# Patient Record
Sex: Male | Born: 1965 | Race: White | Hispanic: No | Marital: Married | Smoking: Never smoker
Health system: Southern US, Community
[De-identification: ages and names within clinical notes are randomized; demographics above are authoritative.]

## PROBLEM LIST (undated history)

## (undated) HISTORY — PX: TONSILLECTOMY: SUR1361

## (undated) HISTORY — PX: SKIN GRAFT: SHX250

## (undated) HISTORY — PX: KIDNEY SURGERY: SHX687

---

## 2012-11-23 ENCOUNTER — Encounter (HOSPITAL_COMMUNITY): Payer: Self-pay | Admitting: Emergency Medicine

## 2012-11-23 ENCOUNTER — Emergency Department (HOSPITAL_COMMUNITY): Payer: Worker's Compensation

## 2012-11-23 ENCOUNTER — Emergency Department (HOSPITAL_COMMUNITY)
Admission: EM | Admit: 2012-11-23 | Discharge: 2012-11-23 | Disposition: A | Discharge status: Home / Self Care | Payer: Worker's Compensation | Attending: Emergency Medicine | Admitting: Emergency Medicine

## 2012-11-23 DIAGNOSIS — S60459A Superficial foreign body of unspecified finger, initial encounter: Secondary | ICD-10-CM

## 2012-11-23 DIAGNOSIS — W268XXA Contact with other sharp object(s), not elsewhere classified, initial encounter: Secondary | ICD-10-CM | POA: Insufficient documentation

## 2012-11-23 DIAGNOSIS — Y929 Unspecified place or not applicable: Secondary | ICD-10-CM | POA: Insufficient documentation

## 2012-11-23 DIAGNOSIS — W010XXA Fall on same level from slipping, tripping and stumbling without subsequent striking against object, initial encounter: Secondary | ICD-10-CM | POA: Insufficient documentation

## 2012-11-23 DIAGNOSIS — Y9301 Activity, walking, marching and hiking: Secondary | ICD-10-CM | POA: Insufficient documentation
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
## 2012-11-23 NOTE — ED Notes (Signed)
Flushed hand with sterile water and washed hand with cleaner.

## 2012-11-23 NOTE — ED Notes (Signed)
Pt went to check on another pt and will be back in his room shortly

## 2012-11-24 NOTE — ED Provider Notes (Signed)
Medical screening examination/treatment/procedure(s) were performed by non-physician practitioner and as supervising physician I was immediately available for consultation/collaboration.  Azhar Yogi M Saga Balthazar, MD 11/24/12 1239 

## 2013-10-11 ENCOUNTER — Emergency Department (HOSPITAL_COMMUNITY)
Admission: EM | Admit: 2013-10-11 | Discharge: 2013-10-11 | Disposition: A | Discharge status: Home / Self Care | Payer: BC Managed Care – PPO | Attending: Emergency Medicine | Admitting: Emergency Medicine

## 2013-10-11 ENCOUNTER — Encounter (HOSPITAL_COMMUNITY): Payer: Self-pay | Admitting: Emergency Medicine

## 2013-10-11 DIAGNOSIS — R301 Vesical tenesmus: Secondary | ICD-10-CM | POA: Insufficient documentation

## 2013-10-11 DIAGNOSIS — K6289 Other specified diseases of anus and rectum: Secondary | ICD-10-CM

## 2013-10-11 DIAGNOSIS — R197 Diarrhea, unspecified: Secondary | ICD-10-CM | POA: Diagnosis present

## 2013-10-11 DIAGNOSIS — R198 Other specified symptoms and signs involving the digestive system and abdomen: Secondary | ICD-10-CM

## 2013-10-11 MED ORDER — CIPROFLOXACIN HCL 500 MG PO TABS
500.0000 mg | ORAL_TABLET | Freq: Two times a day (BID) | ORAL | Status: DC
Start: 2013-10-11 — End: 2016-05-04

## 2013-10-11 MED ORDER — OXYCODONE-ACETAMINOPHEN 5-325 MG PO TABS: 1.0000 | ORAL_TABLET | ORAL | Status: DC | PRN

## 2013-10-11 MED ORDER — LIDOCAINE HCL 2 % EX GEL
1.0000 "application " | Freq: Once | CUTANEOUS | Status: AC
  Administered 2013-10-11: 1
  Filled 2013-10-11: qty 10

## 2013-10-11 MED ORDER — MORPHINE SULFATE 4 MG/ML IJ SOLN
6.0000 mg | Freq: Once | INTRAMUSCULAR | Status: DC
  Filled 2013-10-11: qty 2

## 2013-10-11 MED ORDER — HYDROMORPHONE HCL 1 MG/ML IJ SOLN
1.0000 mg | Freq: Once | INTRAMUSCULAR | Status: AC
  Administered 2013-10-11: 1 mg via INTRAMUSCULAR
  Filled 2013-10-11: qty 1

## 2013-10-11 MED ORDER — LOPERAMIDE HCL 2 MG PO CAPS
4.0000 mg | ORAL_CAPSULE | Freq: Once | ORAL | Status: DC
  Filled 2013-10-11: qty 2

## 2013-10-11 MED ORDER — SODIUM CHLORIDE 0.9 % IV BOLUS (SEPSIS): 1000.0000 mL | Freq: Once | INTRAVENOUS | Status: DC

## 2013-10-11 NOTE — Discharge Instructions (Signed)
° °  Proctitis Proctitis is the swelling and soreness (inflammation) of the lining of the rectum. The rectum is at the end of the large intestine and is attached to the anus. The inflammation causes pain and discomfort. It may be short-term (acute) or long-lasting (chronic). CAUSES Inflammation in the rectum can be caused by many things, such as:  Sexually transmitted diseases (STDs).  Infection.  Anal-rectal trauma or injury.  Ulcerative colitis or Crohn's disease.  Radiation therapy directed near the rectum.  Antibiotic therapy. SYMPTOMS  Sudden, uncomfortable, and frequent urge to have a bowel movement.  Anal or rectal pain.  Abdominal cramping or pain.  Sensation that the rectum is full.  Rectal bleeding.  Pus or mucus discharge from anus.  Diarrhea or frequent soft, loose stools. DIAGNOSIS Diagnosis may include the following:  A history and physical exam.  An STD test.  Blood tests.  Stool tests.  Rectal culture.  A procedure to evaluate the anal canal (anoscopy).  Procedures to look at part, or the entire large bowel (sigmoidoscopy, colonoscopy). TREATMENT Treatment of proctitis depends on the cause. Reducing the symptoms of inflammation and eliminating infection are the main goals of treatment. Treatment may include:  Home remedies and lifestyle, such as sitz baths and avoiding food right before bedtime.  Topical ointments, foams, suppositories, or enemas, such as corticosteroids or anti-inflammatories.  Antibiotic or antiviral medicines to treat infection or to control harmful bacteria.  Medicines to control diarrhea, soften stools, and reduce pain.  Medicines to suppress the immune system.  Avoiding the activity that caused rectal trauma.  Nutritional, dietary, or herbal supplements.  Heat or laser therapy for persistent bleeding.  A dilation procedure to enlarge a narrowed rectum.  Surgery, though rare, may be necessary to repair damaged  rectal lining. HOME CARE INSTRUCTIONS Only take medicines that are recommended or approved by your caregiver.Do not take anti-diarrhea medicine without your caregiver's approval. SEEK MEDICAL CARE IF:  You often experience one or more of the symptoms noted above.  You keep experiencing symptoms after treatment.  You have questions or concerns about your symptoms or treatment plan. MAKE SURE YOU:  Understand these instructions.  Will watch your condition.  Will get help right away if you are not doing well or get worse. [place] of Diabetes and Digestive and Kidney Disease (NIDDK): www.digestive.https://gonzalez-best.com/ Document Released: 12/08/2010 Document Revised: 04/15/2012 Document Reviewed: 12/08/2010 [facility] Patient Information 2015 [place], [place]. This information is not intended to replace advice given to you by your health care provider. Make sure you discuss any questions you have with your health care provider.

## 2013-10-11 NOTE — ED Notes (Signed)
Pt arrived to the ED with a complaint of diarrhea that is painful.  Pt states diarrhea started around 3pm today.  Pt describes episodes as small in nature that are slimy and painful.

## 2013-10-11 NOTE — ED Provider Notes (Signed)
CSN: [id number]     Arrival date & time 10/11/13  2054 History   First MD Initiated Contact with Patient 10/11/13 2105     Chief Complaint  Patient presents with  . Diarrhea     (Consider location/radiation/quality/duration/timing/severity/associated sxs/prior Treatment) HPI  48yM with rectal pain. Onset around 1500 today. Severe pain in rectum/anus. Sensation that he needs to have a BM but either not passing anything or a small amount of liquid stool. Pt is significantly worse when trying to defecate. Improves somewhat but does not resolve. Very foul smelly stool and looks like mucus. No blood. No fever or chills. Past hx of hemorrhoids. Never has symptoms like this before. No urinary complaints. Normal formed stool for the past couple weeks preceding this.   History reviewed. No pertinent past medical history. Past Surgical History  Procedure Laterality Date  . Kidney surgery    . Tonsillectomy     History reviewed. No pertinent family history. History  Substance Use Topics  . Smoking status: Never Smoker   . Smokeless tobacco: Not on file  . Alcohol Use: No    Review of Systems  All systems reviewed and negative, other than as noted in HPI.   Allergies  Review of patient's allergies indicates no known allergies.  Home Medications   Prior to Admission medications   Medication Sig Start Date End Date Taking? Authorizing Provider  methylphenidate (DAYTRANA) 20 MG/9HR Place 1 patch onto the skin daily. wear patch for 9 hours only each day   Yes Historical Provider, MD  ciprofloxacin (CIPRO) 500 MG tablet Take 1 tablet (500 mg total) by mouth 2 (two) times daily. 10/11/13   Virgel Manifold, MD  oxyCODONE-acetaminophen (PERCOCET/ROXICET) 5-325 MG per tablet Take 1-2 tablets by mouth every 4 (four) hours as needed for severe pain. 10/11/13   Virgel Manifold, MD   BP 134/91  Pulse 83  Temp(Src) 98.2 F (36.8 C) (Oral)  Resp 20  Wt 201 lb (91.173 kg)  SpO2 100% Physical Exam   Nursing note and vitals reviewed. Constitutional: He appears well-developed and well-nourished. No distress.  HENT:  Head: Normocephalic and atraumatic.  Eyes: Conjunctivae are normal. Right eye exhibits no discharge. Left eye exhibits no discharge.  Neck: Neck supple.  Cardiovascular: Normal rate, regular rhythm and normal heart sounds.  Exam reveals no gallop and no friction rub.   No murmur heard. Pulmonary/Chest: Effort normal and breath sounds normal. No respiratory distress.  Abdominal: Soft. He exhibits no distension. There is no tenderness.  Genitourinary:  Visual inspectioon unremarkable. Pt with a great deal of discomfort even with just retracting buttocks. No hemorrhoids, fissures or other lesions noted. Would not tolerate DRE.  Musculoskeletal: He exhibits no edema and no tenderness.  Neurological: He is alert.  Skin: Skin is warm and dry.  Psychiatric: He has a normal mood and affect. His behavior is normal. Thought content normal.    ED Course  Procedures (including critical care time) Labs Review Labs Reviewed - No data to display  Imaging Review No results found.   EKG Interpretation None      MDM   Final diagnoses:  Anal or rectal pain  Tenesmus    48yM with rectal pain/tenesmus. No symptom free intervals to suggest proctalgia fugax. Pt refuses DRE and even visual inspection limited. No obvious rectal fissure note. Symptoms seem most consistent with proctitis. PRN pain meds. Cipro for possible proctitis. Abdominal exam benign. About age for colonoscopy anyways. Recommended following up with GI even if  symptoms improve. Return precautions discussed.     Virgel Manifold, MD 10/16/13 [phone]

## 2016-05-04 ENCOUNTER — Ambulatory Visit (INDEPENDENT_AMBULATORY_CARE_PROVIDER_SITE_OTHER): Payer: Self-pay

## 2016-05-04 ENCOUNTER — Ambulatory Visit (INDEPENDENT_AMBULATORY_CARE_PROVIDER_SITE_OTHER): Payer: BC Managed Care – PPO | Admitting: Orthopaedic Surgery

## 2016-05-04 ENCOUNTER — Encounter (INDEPENDENT_AMBULATORY_CARE_PROVIDER_SITE_OTHER): Payer: Self-pay | Admitting: Orthopaedic Surgery

## 2016-05-04 VITALS — BP 125/76 | HR 70 | Resp 14 | Ht 74.0 in | Wt 185.0 lb

## 2016-05-04 DIAGNOSIS — M25562 Pain in left knee: Secondary | ICD-10-CM

## 2016-05-04 DIAGNOSIS — M25561 Pain in right knee: Secondary | ICD-10-CM

## 2016-05-04 MED ORDER — LIDOCAINE HCL 1 % IJ SOLN
5.0000 mL | INTRAMUSCULAR | Status: AC | PRN
  Administered 2016-05-04: 5 mL

## 2016-05-04 MED ORDER — METHYLPREDNISOLONE ACETATE 40 MG/ML IJ SUSP
80.0000 mg | INTRAMUSCULAR | Status: AC | PRN
  Administered 2016-05-04: 80 mg

## 2016-05-04 MED ORDER — BUPIVACAINE HCL 0.5 % IJ SOLN
3.0000 mL | INTRAMUSCULAR | Status: AC | PRN
  Administered 2016-05-04: 3 mL via INTRA_ARTICULAR

## 2016-05-04 NOTE — Progress Notes (Signed)
Office Visit Note   Patient: Cole Allen           Date of Birth: 1965-10-28           MRN: [id number] Visit Date: 05/04/2016              Requested by: Leonard Downing, MD [address] [place], [place] [address] PCP: Leonard Downing, MD   Assessment & Plan: Visit Diagnoses:  1. Acute pain of left knee   2. 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   Orders:  Orders Placed This Encounter  Procedures  . XR KNEE 3 VIEW RIGHT  . XR KNEE 3 VIEW LEFT   No orders of the defined types were placed in this encounter.     Procedures: Large Joint Inj Date/Time: 05/04/2016 3:33 PM Performed by: Garald Balding Authorized by: Garald Balding   Consent Given by:  Patient Timeout: prior to procedure the correct patient, procedure, and site was verified   Indications:  Pain and joint swelling Location:  Knee Site:  R knee Prep: patient was prepped and draped in usual sterile fashion   Needle Size:  25 G Needle Length:  1.5 inches Approach:  Anteromedial Ultrasound Guidance: No   Fluoroscopic Guidance: No   Arthrogram: No   Medications:  5 mL lidocaine 1 %; 80 mg methylPREDNISolone acetate 40 MG/ML; 3 mL bupivacaine 0.5 % Aspiration Attempted: Yes   Aspirate amount (mL):  26 Aspirate:  Yellow and clear Patient tolerance:  Patient tolerated the procedure well with no immediate complications     Clinical Data: No additional findings.   Subjective: Chief Complaint  Patient presents with  . Left Knee - Pain    Cole Allen is a 51 y o that presents with Right and left knee pain x 3 weeks. He relates he hears a "clicking" sound when  moving.  . Right Knee - Pain  Cole Allen is 51 years old and relates more or less chronic problem with both knees. He's had quite a bit of difficulty with bending stooping and squatting with popping clicking and recently some swelling of his right knee. 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 He's had a lot of difficulty with inclines either walking or running or with hyperflexion of both knees. He denies any history of injury or trauma  HPI  Review of Systems   Objective: Vital Signs: BP 125/76   Pulse 70   Resp 14   Ht 6\' 2"  (1.88 m)   Wt 185 lb (83.9 kg)   BMI 23.75 kg/m   Physical Exam  Ortho Exam right knee with positive effusion. The patella crepitation is quite prominent. No instability. Full extension and flexion comparable to the left knee. Positive popliteal cyst without pain. No calf pain. No swelling distally. Neurovascular exam intact. Right knee exam without effusion. Considerable patellar crepitation with flexion and extension no pain with medial lateral motion. No medial lateral joint pain. Range of motion symmetrical to the right side. Positive popliteal mass consistent with a popliteal cyst. No calf pain. No swelling distally. Neurovascular exam intact.      Imaging: Xr Knee 3 View Left  Result Date: 05/04/2016 Films  of the left knee are obtained in 3 projections standing. Joint spaces are well maintained without irregularity. There is a small peripheral osteophyte on the lateral femoral condyle. No ectopic calcification. Normal alignment patellofemoral joint intact without malalignment  Xr Knee 3 View Right  Result Date: 05/04/2016 films of the right knee were obtained in 3 projections standing. There are some peripheral osteophytes in the lateral femoral condyle but no significant decrease in the joint space. Alignment appears to be anatomic. No ectopic calcification. On the lateral film there is some prominence of the condyle  laterally which could contribute to the symptomatic chondromalacia patella patella tracks in the midline without any osteophytes    PMFS History: There are no active problems to display for this patient.  History reviewed. No pertinent past medical history.  History reviewed. No pertinent family history.  Past Surgical History:  Procedure Laterality Date  . KIDNEY SURGERY    . TONSILLECTOMY     Social History   Occupational History  . Not on file.   Social History Main Topics  . Smoking status: Never Smoker  . Smokeless tobacco: Never Used  . Alcohol use No  . Drug use: No  . Sexual activity: Yes    Partners: Male     Garald Balding, MD   Note - This record has been created using [employer].  Chart creation errors have been sought, but may not always  have been located. Such creation errors do not reflect on  the standard of medical care.

## 2016-06-02 ENCOUNTER — Ambulatory Visit (INDEPENDENT_AMBULATORY_CARE_PROVIDER_SITE_OTHER): Payer: Self-pay | Admitting: Orthopaedic Surgery

## 2016-09-06 ENCOUNTER — Encounter (INDEPENDENT_AMBULATORY_CARE_PROVIDER_SITE_OTHER): Payer: Self-pay | Admitting: Orthopedic Surgery

## 2016-09-06 ENCOUNTER — Ambulatory Visit (INDEPENDENT_AMBULATORY_CARE_PROVIDER_SITE_OTHER): Payer: BC Managed Care – PPO | Admitting: Orthopedic Surgery

## 2016-09-06 ENCOUNTER — Ambulatory Visit (INDEPENDENT_AMBULATORY_CARE_PROVIDER_SITE_OTHER): Payer: Self-pay

## 2016-09-06 VITALS — BP 124/68 | HR 74 | Resp 18 | Ht 72.0 in | Wt 207.0 lb

## 2016-09-06 DIAGNOSIS — M7541 Impingement syndrome of right shoulder: Secondary | ICD-10-CM | POA: Diagnosis not present

## 2016-09-06 DIAGNOSIS — M25511 Pain in right shoulder: Secondary | ICD-10-CM

## 2016-09-06 MED ORDER — DICLOFENAC SODIUM 1 % TD GEL: 2.0000 g | Freq: Four times a day (QID) | TRANSDERMAL | 2 refills | Status: DC

## 2016-09-06 MED ORDER — LIDOCAINE HCL 1 % IJ SOLN
2.0000 mL | INTRAMUSCULAR | Status: AC | PRN
  Administered 2016-09-06: 2 mL

## 2016-09-06 MED ORDER — METHYLPREDNISOLONE ACETATE 40 MG/ML IJ SUSP
80.0000 mg | INTRAMUSCULAR | Status: AC | PRN
  Administered 2016-09-06: 80 mg

## 2016-09-06 MED ORDER — BUPIVACAINE HCL 0.5 % IJ SOLN
2.0000 mL | INTRAMUSCULAR | Status: AC | PRN
  Administered 2016-09-06: 2 mL via INTRA_ARTICULAR

## 2016-09-06 NOTE — Progress Notes (Signed)
Office Visit Note   Patient: Cole Allen           Date of Birth: 1965-03-30           MRN: [id number] Visit Date: 09/06/2016              Requested by: No referring provider defined for this encounter. PCP: Rubie Maid, MD   Assessment & Plan: Visit Diagnoses:  1. Impingement syndrome of right shoulder   2. Acute pain of right shoulder     Plan:  #1: Corticosteroid injection to the right shoulder subacromially with good results #2: If he does not have improvement he will give [place] call we can schedule an MRI scan  Follow-Up Instructions: Return if symptoms worsen or fail to improve.   Orders:  Orders Placed This Encounter  Procedures  . Large Joint Injection/Arthrocentesis  . XR Shoulder Right   Meds ordered this encounter  Medications  . diclofenac sodium (VOLTAREN) 1 % GEL    Sig: Apply 2 g topically 4 (four) times daily.    Dispense:  3 Tube    Refill:  2    Order Specific Question:   Supervising Provider    Answer:   Garald Balding [[id number]]      Procedures: Large Joint Inj Date/Time: 09/06/2016 1:33 PM Performed by: Biagio Borg D Authorized by: Biagio Borg D   Consent Given by:  Patient Timeout: prior to procedure the correct patient, procedure, and site was verified   Indications:  Pain Location:  Shoulder Site:  R subacromial bursa Prep: patient was prepped and draped in usual sterile fashion   Needle Size:  25 G Needle Length:  1.5 inches Approach:  Lateral Ultrasound Guidance: No   Fluoroscopic Guidance: No   Arthrogram: No   Medications:  2 mL bupivacaine 0.5 %; 2 mL lidocaine 1 %; 80 mg methylPREDNISolone acetate 40 MG/ML Aspiration Attempted: No   Patient tolerance:  Patient tolerated the procedure well with no immediate complications      Clinical Data: No additional findings.   Subjective: Chief Complaint  Patient presents with  . Right Shoulder - Pain    Pain x 1week, esp at night ? 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 This is mainly centered around the lateral subacromial area and some anteriorly. He thinks he did this while he was doing his exercises and working out in the gym. He doesn't remember an exact episode that caused him severe pain but he notices after his workout. He denies any previous injuries to his shoulder up. This is bothering him enough that he has chosen to be seen for evaluation. Denies any numbness or tingling in the extremity.         Review of Systems  All other systems reviewed and are negative.    Objective: Vital Signs: BP 124/68   Pulse 74   Resp 18   Ht 6' (1.829 m)   Wt 207 lb (93.9 kg)   BMI 28.07 kg/m   Physical Exam  Constitutional: He is oriented to person, place, and time. He appears well-developed and well-nourished.  HENT:  Head: Normocephalic and atraumatic.  Eyes: Pupils are equal, round, and reactive to light. EOM are normal.  Pulmonary/Chest: Effort normal.  Neurological: He is alert and oriented to person, place,  and time.  Skin: Skin is warm and dry.  Psychiatric: He has a normal mood and affect. His behavior is normal. Judgment and thought content normal.    Ortho Exam  Today he has a little bit tenderness over the before meals joint. He does 160 of abduction and forward flexion. With the arm at 90 of abduction he has around 80 of external rotation low but limited only about 45 of internal rotation. Empty can test is mildly positive. No apprehension. No crepitance. No pain referable from his cervical spine with range of motion.  Specialty Comments:  No specialty comments available.  Imaging: Xr Shoulder Right  Result Date: 09/06/2016 4 view x-ray of the right shoulder reveals some mild before meals sclerosing. He does have a type 2-2-1/2 acromion. There may be  a small spur on the inferior humeral neck.    PMFS History: There are no active problems to display for this patient.  History reviewed. No pertinent past medical history.  No family history on file.  Past Surgical History:  Procedure Laterality Date  . KIDNEY SURGERY    . TONSILLECTOMY     Social History   Occupational History  . Not on file.   Social History Main Topics  . Smoking status: Never Smoker  . Smokeless tobacco: Never Used  . Alcohol use No  . Drug use: No  . Sexual activity: Yes    Partners: Male

## 2016-12-11 ENCOUNTER — Ambulatory Visit (INDEPENDENT_AMBULATORY_CARE_PROVIDER_SITE_OTHER): Payer: BC Managed Care – PPO | Admitting: Orthopaedic Surgery

## 2016-12-12 ENCOUNTER — Ambulatory Visit (INDEPENDENT_AMBULATORY_CARE_PROVIDER_SITE_OTHER): Payer: BC Managed Care – PPO | Admitting: Orthopaedic Surgery

## 2016-12-12 ENCOUNTER — Encounter (INDEPENDENT_AMBULATORY_CARE_PROVIDER_SITE_OTHER): Payer: Self-pay | Admitting: Orthopaedic Surgery

## 2016-12-12 VITALS — Resp 14 | Ht 72.0 in | Wt 207.0 lb

## 2016-12-12 DIAGNOSIS — M25511 Pain in right shoulder: Secondary | ICD-10-CM

## 2016-12-12 DIAGNOSIS — M7541 Impingement syndrome of right shoulder: Secondary | ICD-10-CM

## 2016-12-12 DIAGNOSIS — G8929 Other chronic pain: Secondary | ICD-10-CM | POA: Diagnosis not present

## 2016-12-12 MED ORDER — LIDOCAINE HCL 2 % IJ SOLN
4.0000 mL | INTRAMUSCULAR | Status: AC | PRN
  Administered 2016-12-12: 4 mL

## 2016-12-12 MED ORDER — BUPIVACAINE HCL 0.5 % IJ SOLN
3.0000 mL | INTRAMUSCULAR | Status: AC | PRN
  Administered 2016-12-12: 3 mL via INTRA_ARTICULAR

## 2016-12-12 MED ORDER — METHYLPREDNISOLONE ACETATE 40 MG/ML IJ SUSP
80.0000 mg | INTRAMUSCULAR | Status: AC | PRN
  Administered 2016-12-12: 80 mg

## 2016-12-12 NOTE — Progress Notes (Signed)
Office Visit Note   Patient: Cole Allen           Date of Birth: [birth year]           MRN: [id number] Visit Date: 12/12/2016              Requested by: [place] [address] [place], [place] [address] PCP: Rubie Maid, MD   Assessment & Plan: Visit Diagnoses:  1. Impingement syndrome of right shoulder   2. Chronic right shoulder pain     Plan:  #1: At this time he has refused to have an MRI arthrogram of the right shoulder but would like to proceed with a corticosteroid injection to the right shoulder again. This was accomplished both intra-articular and extra-articular. He had complete relief in his symptoms and was not painful on exam. #2: If he does not have continued improvement and our plan will be to have him give [place] a call and we will do an MRI arthrogram of the shoulder to rule out labral pathology as well as rotator cuff pathology.  Follow-Up Instructions: Return if symptoms worsen or fail to improve.   Orders:  No orders of the defined types were placed in this encounter.  No orders of the defined types were placed in this encounter.     Procedures: Large Joint Inj: R subacromial bursa on 12/12/2016 4:38 PM Indications: pain and diagnostic evaluation Details: 25 G 1.5 in needle, posterior approach  Arthrogram: No  Medications: 80 mg methylPREDNISolone acetate 40 MG/ML; 4 mL lidocaine 2 %; 3 mL bupivacaine 0.5 %  Aliquot of mixture was placed intra-articular and subacromial. Procedure, treatment alternatives, risks and benefits explained, specific risks discussed. Consent was given by the patient. Immediately prior to procedure a time out was called to verify the correct patient, procedure, equipment, support staff and site/side marked as required. Patient was prepped and draped in the usual sterile fashion.       Clinical Data: No additional findings.   Subjective: Chief Complaint  Patient presents with  . Right Shoulder -  Pain    [name] is a 26 y o with Right shoulder pain that radiates down his arm and he has had numbness in his r hand. 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    HPI  Cole Allen is a 51 year old white male who was a [place] [employer] psychologist who we have seen in the past right shoulder pain. Back in late August and early September he had developed pain discomfort in his right shoulder. This was mainly centered around the lateral subacromial area and anteriorly. He felt he may have injured it while doing his exercises and working out in the gym. Does not remember an exact episode because of the severe pain. He notices it though after his workouts. No previous history of injury or trauma to the shoulder. He was seen on 09/06/2016 and subacromial corticosteroid injection was given. He states it really did not help his symptoms. Actually they are worsening now especially as he does any type of cross chest maneuver or tries to put something on the left-hand side of his belt with his right hand. He also complains of numbness in his right hand. This is mainly nighttime. Seen today for reevaluation.  Review of Systems  Constitutional: Negative for fatigue.  HENT: Negative for hearing loss.   Respiratory: Negative for apnea, chest tightness and shortness of breath.  Cardiovascular: Negative for chest pain, palpitations and leg swelling.  Gastrointestinal: Negative for blood in stool, constipation and diarrhea.  Genitourinary: Negative for difficulty urinating.  Musculoskeletal: Negative for arthralgias, back pain, joint swelling, myalgias, neck pain and neck stiffness.  Neurological: Positive for weakness and numbness. Negative for headaches.  Hematological: Does not bruise/bleed easily.  Psychiatric/Behavioral: Negative for sleep disturbance. The patient is not nervous/anxious.      Objective: Vital Signs: Resp 14   Ht 6' (1.829 m)   Wt 207 lb (93.9  kg)   BMI 28.07 kg/m   Physical Exam  Constitutional: He is oriented to person, place, and time. He appears well-developed and well-nourished.  HENT:  Head: Normocephalic and atraumatic.  Eyes: EOM are normal. Pupils are equal, round, and reactive to light.  Pulmonary/Chest: Effort normal.  Neurological: He is alert and oriented to person, place, and time.  Skin: Skin is warm and dry.  Psychiatric: He has a normal mood and affect. His behavior is normal. Judgment and thought content normal.    Ortho Exam  Today he has some tenderness over the [facility] joint. 170 of abduction and forward flexion. With the arm in 90 of abduction he has 80 of external rotation and 60 of internal rotation. Empty can test is mildly positive. No apprehension.  Specialty Comments:  No specialty comments available.  Imaging: No results found.   PMFS History: There are no active problems to display for this patient.  History reviewed. No pertinent past medical history.  History reviewed. No pertinent family history.  Past Surgical History:  Procedure Laterality Date  . KIDNEY SURGERY    . TONSILLECTOMY     Social History   Occupational History  . Not on file  Tobacco Use  . Smoking status: Never Smoker  . Smokeless tobacco: Never Used  Substance and Sexual Activity  . Alcohol use: No  . Drug use: No  . Sexual activity: Yes    Partners: Male

## 2019-12-24 ENCOUNTER — Telehealth: Payer: Self-pay | Admitting: Orthopaedic Surgery

## 2019-12-24 NOTE — Telephone Encounter (Signed)
Please prepare Margaretmary Dys CD of any images for a request I have. Let me know when ready, I can pick up. Thank you!

## 2020-01-14 ENCOUNTER — Telehealth: Payer: Self-pay | Admitting: Orthopaedic Surgery

## 2020-01-14 NOTE — Telephone Encounter (Signed)
Actually, just the shoulder. Please. Thanks!

## 2020-01-14 NOTE — Telephone Encounter (Signed)
Please copy all images to CD. I need to mail another disc as the one received by [facility] was cracked. Please let me know when ready and I can pick up. Thanks!

## 2020-01-15 NOTE — Telephone Encounter (Signed)
CD made 01/12

## 2021-06-02 ENCOUNTER — Other Ambulatory Visit: Payer: Self-pay

## 2021-06-02 ENCOUNTER — Encounter (HOSPITAL_COMMUNITY): Payer: Self-pay

## 2021-06-02 ENCOUNTER — Emergency Department (HOSPITAL_COMMUNITY)
Admission: EM | Admit: 2021-06-02 | Discharge: 2021-06-03 | Disposition: A | Discharge status: Home / Self Care | Payer: BC Managed Care – PPO | Attending: Emergency Medicine | Admitting: Emergency Medicine

## 2021-06-02 DIAGNOSIS — R590 Localized enlarged lymph nodes: Secondary | ICD-10-CM

## 2021-06-02 DIAGNOSIS — M542 Cervicalgia: Secondary | ICD-10-CM | POA: Insufficient documentation

## 2021-06-02 DIAGNOSIS — Z8572 Personal history of non-Hodgkin lymphomas: Secondary | ICD-10-CM

## 2021-06-02 LAB — BASIC METABOLIC PANEL
Anion gap: 7 (ref 5–15)
BUN: 13 mg/dL (ref 6–20)
CO2: 25 mmol/L (ref 22–32)
Calcium: 9.1 mg/dL (ref 8.9–10.3)
Chloride: 107 mmol/L (ref 98–111)
Creatinine, Ser: 0.95 mg/dL (ref 0.61–1.24)
GFR, Estimated: >60 mL/min (ref >60)
Glucose, Bld: 95 mg/dL (ref 70–99)
Potassium: 3.9 mmol/L (ref 3.5–5.1)
Sodium: 139 mmol/L (ref 135–145)

## 2021-06-02 LAB — CBC
HCT: 44.7 % (ref 39.0–52.0)
Hemoglobin: 15.3 g/dL (ref 13.0–17.0)
MCH: 32.4 pg (ref 26.0–34.0)
MCHC: 34.2 g/dL (ref 30.0–36.0)
MCV: 94.7 fL (ref 80.0–100.0)
Platelets: 222 10*3/uL (ref 150–400)
RBC: 4.72 MIL/uL (ref 4.22–5.81)
RDW: 12.8 % (ref 11.5–15.5)
WBC: 4.8 10*3/uL (ref 4.0–10.5)
nRBC: 0 % (ref 0.0–0.2)

## 2021-06-02 NOTE — ED Provider Triage Note (Signed)
Emergency Medicine Provider Triage Evaluation Note  Cole Allen , a 56 y.o. male  was evaluated in triage.  Pt complains of lump to left side of neck.  Patient reports that 1 month ago he noticed swelling to the left side of his neck which he believed to be his lymph node.  Patient has history of non-Hodgkin lymphoma.  Patient states that over the course of the last month, the swelling has fluctuated.  Patient recently seen by his oncologist as well as dentist and had negative work-up there.  Patient supposed to see ENT doctor next week for CT scan however the patient states that he cannot wait that long is requesting CT scan here.  Patient denies fevers, nausea, vomiting, diarrhea, chest pain, shortness of breath, sore throat, ear pain, headache.  Review of Systems  Positive:  Negative:   Physical Exam  BP (!) 129/94 (BP Location: Left Arm)   Pulse 76   Temp 97.8 F (36.6 C)   Resp 20   SpO2 98%  Gen:   Awake, no distress   Resp:  Normal effort  MSK:   Moves extremities without difficulty  Other:    Medical Decision Making  Medically screening exam initiated at 9:02 PM.  Appropriate orders placed.  Cole Allen was informed that the remainder of the evaluation will be completed by another provider, this initial triage assessment does not replace that evaluation, and the importance of remaining in the ED until their evaluation is complete.     Azucena Cecil, PA-C 06/02/21 2103

## 2021-06-02 NOTE — ED Provider Notes (Signed)
[facility] EMERGENCY DEPARTMENT Provider Note   CSN: [id number] Arrival date & time: 06/02/21  2003     History  Chief Complaint  Patient presents with   Neck Pain    Cole Allen is a 56 y.o. male.  Patient is a 56 year old male with past medical history of non-Hodgkin's lymphoma treated with chemotherapy in the past.  He reports being in remission.  Patient presenting today for evaluation of a lesion noted to the back of his mouth.  This has been worsening over the past several days denies.  This was mentioned to his oncologist and is due to have a CT scan and PET scan next week.  Patient now noticing swelling under his left mandible and left side of his face.  He was instructed by his oncologist to come to the ER to be evaluated over airway concerns.  Patient denies to me that he has having any difficulty breathing.  He does describe worsening ability to chew due to discomfort when opening his mouth.  He denies any fevers or chills.  The history is provided by the patient.      Home Medications Prior to Admission medications   Medication Sig Start Date End Date Taking? Authorizing Provider  diclofenac sodium (VOLTAREN) 1 % GEL Apply 2 g topically 4 (four) times daily. Patient not taking: Reported on 12/12/2016 09/06/16   Cherylann Ratel, PA-C  methylphenidate [facility]) 20 MG/9HR Place onto the skin. 01/26/16   [provider]  methylphenidate (METADATE ER) 20 MG ER tablet Take 20 mg by mouth daily.    [provider]      Allergies    Patient has no known allergies.    Review of Systems   Review of Systems  All other systems reviewed and are negative.  Physical Exam Updated Vital Signs BP 133/89   Pulse 82   Temp 98.2 F (36.8 C)   Resp 17   SpO2 98%  Physical Exam Vitals and nursing note reviewed.  Constitutional:      General: He is not in acute distress.    Appearance: He is well-developed. He is not diaphoretic.  HENT:      Head: Normocephalic and atraumatic.     Mouth/Throat:     Mouth: Mucous membranes are moist.     Pharynx: No oropharyngeal exudate or posterior oropharyngeal erythema.     Comments: Just posterior to the left upper molar, there is an area of swelling of the oral mucosa measuring approximately 2 cm round. Neck:     Comments: There is fullness noted inferior to the left mandible, but no crepitus.  There is no stridor. Cardiovascular:     Rate and Rhythm: Normal rate and regular rhythm.     Heart sounds: No murmur heard.   No friction rub.  Pulmonary:     Effort: Pulmonary effort is normal. No respiratory distress.     Breath sounds: Normal breath sounds. No stridor. No wheezing or rales.  Abdominal:     General: Bowel sounds are normal. There is no distension.     Palpations: Abdomen is soft.     Tenderness: There is no abdominal tenderness.  Musculoskeletal:        General: Normal range of motion.     Cervical back: Normal range of motion and neck supple.  Skin:    General: Skin is warm and dry.  Neurological:     Mental Status: He is alert and oriented to person,  place, and time.     Coordination: Coordination normal.    ED Results / Procedures / Treatments   Labs (all labs ordered are listed, but only abnormal results are displayed) Labs Reviewed  BASIC METABOLIC PANEL  CBC    EKG None  Radiology No results found.  Procedures Procedures  {Document cardiac monitor, telemetry assessment procedure when appropriate:1}  Medications Ordered in ED Medications - No data to display  ED Course/ Medical Decision Making/ A&P                           Medical Decision Making Amount and/or Complexity of Data Reviewed Labs: ordered. Radiology: ordered.   ***  {Document critical care time when appropriate:1} {Document review of labs and clinical decision tools ie heart score, Chads2Vasc2 etc:1}  {Document your independent review of radiology images, and any outside  records:1} {Document your discussion with family members, caretakers, and with consultants:1} {Document social determinants of health affecting pt's care:1} {Document your decision making why or why not admission, treatments were needed:1} Final Clinical Impression(s) / ED Diagnoses Final diagnoses:  None    Rx / DC Orders ED Discharge Orders     None

## 2021-06-02 NOTE — ED Triage Notes (Signed)
Pt reports he is here today due to neck pain.Pt reports that it started a couple weeks ago and went to his dentist & oncologist and they rule it out . Pt reports today when he woke up the swelling moved up his neck to the side of his face. Pt reports his oncologist referred him to the ENT provider but he is unable to be seen till next week.

## 2021-06-03 ENCOUNTER — Emergency Department (HOSPITAL_COMMUNITY): Payer: BC Managed Care – PPO

## 2021-06-03 MED ORDER — CLINDAMYCIN HCL 300 MG PO CAPS
300.0000 mg | ORAL_CAPSULE | Freq: Four times a day (QID) | ORAL | 0 refills | Status: DC
Start: 2021-06-03 — End: 2022-01-06

## 2021-06-03 MED ORDER — IOHEXOL 300 MG/ML  SOLN
100.0000 mL | Freq: Once | INTRAMUSCULAR | Status: AC | PRN
  Administered 2021-06-03: 75 mL via INTRAVENOUS

## 2021-06-03 NOTE — Discharge Instructions (Signed)
Begin taking clindamycin as prescribed.  Follow-up with your oncologist and primary doctor in the next few days.  Keep your appointment for your PET scan as previously arranged.  Return to the emergency department if you develop difficulty breathing, worsening swallowing, or other new and concerning symptoms.

## 2022-01-06 ENCOUNTER — Encounter: Payer: Self-pay | Admitting: Internal Medicine

## 2022-01-06 ENCOUNTER — Ambulatory Visit: Payer: BC Managed Care – PPO | Admitting: Internal Medicine

## 2022-01-06 VITALS — BP 130/92 | HR 100 | Ht 72.0 in | Wt 227.6 lb

## 2022-01-06 DIAGNOSIS — Z0001 Encounter for general adult medical examination with abnormal findings: Secondary | ICD-10-CM | POA: Diagnosis not present

## 2022-01-06 DIAGNOSIS — Z131 Encounter for screening for diabetes mellitus: Secondary | ICD-10-CM | POA: Diagnosis not present

## 2022-01-06 DIAGNOSIS — Z2821 Immunization not carried out because of patient refusal: Secondary | ICD-10-CM

## 2022-01-06 DIAGNOSIS — F988 Other specified behavioral and emotional disorders with onset usually occurring in childhood and adolescence: Secondary | ICD-10-CM

## 2022-01-06 DIAGNOSIS — C8330 Diffuse large B-cell lymphoma, unspecified site: Secondary | ICD-10-CM

## 2022-01-06 DIAGNOSIS — E782 Mixed hyperlipidemia: Secondary | ICD-10-CM

## 2022-01-06 DIAGNOSIS — I10 Essential (primary) hypertension: Secondary | ICD-10-CM | POA: Diagnosis not present

## 2022-01-06 DIAGNOSIS — Z1321 Encounter for screening for nutritional disorder: Secondary | ICD-10-CM

## 2022-01-06 DIAGNOSIS — Z1159 Encounter for screening for other viral diseases: Secondary | ICD-10-CM

## 2022-01-06 DIAGNOSIS — Z114 Encounter for screening for human immunodeficiency virus [HIV]: Secondary | ICD-10-CM

## 2022-01-06 DIAGNOSIS — Z1329 Encounter for screening for other suspected endocrine disorder: Secondary | ICD-10-CM

## 2022-01-06 MED ORDER — AMLODIPINE BESYLATE 5 MG PO TABS: 5.0000 mg | ORAL_TABLET | Freq: Every day | ORAL | 2 refills | Status: DC

## 2022-01-06 NOTE — Progress Notes (Unsigned)
New Patient Office Visit  Subjective    Patient ID: Cole Allen, male    DOB: 1965-10-25  Age: 57 y.o. MRN: [id number]  CC:  Chief Complaint  Patient presents with   Establish Care   HPI Cole Allen presents to establish care.  He is a 57 year old male with a past medical history significant for ADD, HTN, and diffuse large B-cell lymphoma.  He has recently moved to the area and was previously followed by [facility] in [place], [place].  He is also followed by oncology at [facility] for treatment of DLBCL.  Overall [name] reports feeling well today. He reports that he has oncology follow-up scheduled for next week and will undergo a PET scan as well as he has experienced swelling in the left temporal region and is concerned for recurrence of disease.  He is also concerned that HCTZ is not working for treatment of hypertension.  Acute concerns, chronic medical conditions, and outstanding preventative care items discussed today are individually addressed in A/P below.  Outpatient Encounter Medications as of 01/06/2022  Medication Sig   acyclovir (ZOVIRAX) 400 MG tablet Take 1 tablet (400 mg) by mouth two times per day.  Begin with LD therapy and continue for at least 13-month post CAR-T cell infusion AND until CD4 > 200.   amLODipine (NORVASC) 5 MG tablet Take 1 tablet (5 mg total) by mouth daily.   methylphenidate (METADATE ER) 20 MG ER tablet Take 20 mg by mouth daily.   [DISCONTINUED] busPIRone (BUSPAR) 5 MG tablet Take 5 mg by mouth daily.   [DISCONTINUED] clobetasol cream (TEMOVATE) 03.41% Apply 1 Application topically 2 (two) times daily.   [DISCONTINUED] hydrochlorothiazide (HYDRODIURIL) 12.5 MG tablet Take by mouth.   [DISCONTINUED] methylphenidate (DAYTRANA) 20 MG/9HR Place onto the skin.   [DISCONTINUED] clindamycin (CLEOCIN) 300 MG capsule Take 1 capsule (300 mg total) by mouth 4 (four) times daily. X 7 days (Patient not taking: Reported on 01/06/2022)    [DISCONTINUED] dexamethasone (DECADRON) 1 MG tablet Take by mouth. (Patient not taking: Reported on 01/06/2022)   [DISCONTINUED] dexamethasone (DECADRON) 2 MG tablet Take by mouth. (Patient not taking: Reported on 01/06/2022)   [DISCONTINUED] dexamethasone (DECADRON) 4 MG tablet Take by mouth. (Patient not taking: Reported on 01/06/2022)   [DISCONTINUED] diclofenac sodium (VOLTAREN) 1 % GEL Apply 2 g topically 4 (four) times daily. (Patient not taking: Reported on 12/12/2016)   [DISCONTINUED] doxycycline (ADOXA) 100 MG tablet Take 100 mg by mouth 2 (two) times daily. (Patient not taking: Reported on 01/06/2022)   [DISCONTINUED] fluconazole (DIFLUCAN) 200 MG tablet Take by mouth. (Patient not taking: Reported on 01/06/2022)   [DISCONTINUED] levETIRAcetam (KEPPRA) 500 MG tablet Take 500 mg by mouth 2 (two) times daily. (Patient not taking: Reported on 01/06/2022)   [DISCONTINUED] levofloxacin (LEVAQUIN) 500 MG tablet Take 500 mg by mouth daily. (Patient not taking: Reported on 01/06/2022)   [DISCONTINUED] lidocaine (XYLOCAINE) 2 % solution 5 mLs 4 (four) times daily as needed. (Patient not taking: Reported on 01/06/2022)   [DISCONTINUED] OLANZapine (ZYPREXA) 5 MG tablet Take 5 mg by mouth at bedtime. (Patient not taking: Reported on 01/06/2022)   [DISCONTINUED] ondansetron (ZOFRAN) 8 MG tablet Take by mouth. (Patient not taking: Reported on 01/06/2022)   [DISCONTINUED] prochlorperazine (COMPAZINE) 10 MG tablet Take by mouth. (Patient not taking: Reported on 01/06/2022)   [DISCONTINUED] sulfamethoxazole-trimethoprim (BACTRIM DS) 800-160 MG tablet Take 1 tablet by mouth 3 (three) times a week. (Patient not taking: Reported on 01/06/2022)  No facility-administered encounter medications on file as of 01/06/2022.    History reviewed. No pertinent past medical history.  Past Surgical History:  Procedure Laterality Date   KIDNEY SURGERY     TONSILLECTOMY      History reviewed. No pertinent family history.  Social History    Socioeconomic History   Marital status: Married    Spouse name: Not on file   Number of children: Not on file   Years of education: Not on file   Highest education level: Not on file  Occupational History   Not on file  Tobacco Use   Smoking status: Never   Smokeless tobacco: Never  Vaping Use   Vaping Use: Never used  Substance and Sexual Activity   Alcohol use: No   Drug use: No   Sexual activity: Yes    Partners: Male  Other Topics Concern   Not on file  Social History Narrative   Not on file   Social Determinants of Health   Financial Resource Strain: Not on file  Food Insecurity: Not on file  Transportation Needs: Not on file  Physical Activity: Not on file  Stress: Not on file  Social Connections: Not on file  Intimate Partner Violence: Not on file    Review of Systems  Constitutional:  Negative for chills, fever, malaise/fatigue and weight loss.  HENT:  Negative for sore throat.        Swelling in left temporal region  Respiratory:  Negative for cough and shortness of breath.   Cardiovascular:  Negative for chest pain, palpitations and leg swelling.  Gastrointestinal:  Negative for abdominal pain, blood in stool, constipation, diarrhea, nausea and vomiting.  Genitourinary:  Negative for dysuria and hematuria.  Musculoskeletal:  Negative for myalgias.  Skin:  Negative for itching and rash.  Neurological:  Negative for dizziness and headaches.  Psychiatric/Behavioral:  Negative for depression and suicidal ideas.    Objective    BP (!) 130/92   Pulse 100   Ht 6' (1.829 m)   Wt 227 lb 9.6 oz (103.2 kg)   SpO2 95%   BMI 30.87 kg/m   Physical Exam Vitals reviewed.  Constitutional:      General: He is not in acute distress.    Appearance: Normal appearance. He is not ill-appearing.  HENT:     Head: Normocephalic and atraumatic.     Comments: Palpable, non-tender growth in L temporal region of head    Right Ear: External ear normal.     Left Ear:  External ear normal.     Nose: Nose normal. No congestion or rhinorrhea.     Mouth/Throat:     Mouth: Mucous membranes are moist.     Pharynx: Oropharynx is clear.  Eyes:     General: No scleral icterus.    Extraocular Movements: Extraocular movements intact.     Conjunctiva/sclera: Conjunctivae normal.     Pupils: Pupils are equal, round, and reactive to light.  Cardiovascular:     Rate and Rhythm: Normal rate and regular rhythm.     Pulses: Normal pulses.     Heart sounds: Normal heart sounds. No murmur heard. Pulmonary:     Effort: Pulmonary effort is normal.     Breath sounds: Normal breath sounds. No wheezing, rhonchi or rales.  Abdominal:     General: Abdomen is flat. Bowel sounds are normal. There is no distension.     Palpations: Abdomen is soft.     Tenderness: There is no abdominal  tenderness.  Musculoskeletal:        General: No swelling or deformity. Normal range of motion.     Cervical back: Normal range of motion.  Skin:    General: Skin is warm and dry.     Capillary Refill: Capillary refill takes less than 2 seconds.  Neurological:     General: No focal deficit present.     Mental Status: He is alert and oriented to person, place, and time.     Motor: No weakness.  Psychiatric:        Mood and Affect: Mood normal.        Behavior: Behavior normal.        Thought Content: Thought content normal.    Assessment & Plan:   Problem List Items Addressed This Visit       Essential hypertension - Primary    Previous history of essential hypertension.  He is currently prescribed HCTZ 12.5 mg daily.  He is concerned that HCTZ is not working as his diastolic pressures remain elevated.  His blood pressure today is 127/99.  He is interested in other medication options. -Discontinue HCTZ.  Start amlodipine 5 mg daily.  Will plan for follow-up in 4 weeks for HTN check.      ADD (attention deficit disorder)    He is currently prescribed methylphenidate 20 mg daily for  treatment of ADD.  This is currently managed by his oncologist.  No medication changes today.  PDMP reviewed and is appropriate.      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  He completed CAR-T therapy in August.  He has follow-up scheduled for next week and is concerned about a growth along the left temporal region of his head.  He is concerned that this may reflect a recurrence of disease.  He will undergo a PET scan in the near future.      Encounter for general adult medical examination with abnormal findings    Presenting today to establish care.  Previous records and labs been reviewed. -Baseline labs ordered today, including one-time HIV/HCV screening -He reports that he has received vaccines through his oncologist -We will tentatively plan for follow-up in 4 weeks      Return in about 4 weeks (around 02/03/2022) for HTN.   Johnette Abraham, MD

## 2022-01-06 NOTE — Patient Instructions (Signed)
It was a pleasure to see you today.  Thank you for giving [place] the opportunity to be involved in your care.  Below is a brief recap of your visit and next steps.  We will plan to see you again in 4 weeks.  Summary You have established care today  Discontinue HCTZ and start amlodipine 5 mg daily We will check labs today and plan to follow up in 4 weeks for BP check

## 2022-01-11 ENCOUNTER — Encounter: Payer: Self-pay | Admitting: Internal Medicine

## 2022-01-11 DIAGNOSIS — I10 Essential (primary) hypertension: Secondary | ICD-10-CM | POA: Insufficient documentation

## 2022-01-11 DIAGNOSIS — F988 Other specified behavioral and emotional disorders with onset usually occurring in childhood and adolescence: Secondary | ICD-10-CM | POA: Insufficient documentation

## 2022-01-11 DIAGNOSIS — Z0001 Encounter for general adult medical examination with abnormal findings: Secondary | ICD-10-CM | POA: Insufficient documentation

## 2022-01-11 DIAGNOSIS — C8330 Diffuse large B-cell lymphoma, unspecified site: Secondary | ICD-10-CM | POA: Insufficient documentation

## 2022-01-11 NOTE — Assessment & Plan Note (Signed)
Followed by oncology at [facility].  He completed CAR-T therapy in August.  He has follow-up scheduled for next week and is concerned about a growth along the left temporal region of his head.  He is concerned that this may reflect a recurrence of disease.  He will undergo a PET scan in the near future.

## 2022-01-11 NOTE — Assessment & Plan Note (Signed)
Previous history of essential hypertension.  He is currently prescribed HCTZ 12.5 mg daily.  He is concerned that HCTZ is not working as his diastolic pressures remain elevated.  His blood pressure today is 127/99.  He is interested in other medication options. -Discontinue HCTZ.  Start amlodipine 5 mg daily.  Will plan for follow-up in 4 weeks for HTN check.

## 2022-01-11 NOTE — Assessment & Plan Note (Signed)
Presenting today to establish care.  Previous records and labs been reviewed. -Baseline labs ordered today, including one-time HIV/HCV screening -He reports that he has received vaccines through his oncologist -We will tentatively plan for follow-up in 4 weeks

## 2022-01-11 NOTE — Assessment & Plan Note (Addendum)
He is currently prescribed methylphenidate 20 mg daily for treatment of ADD.  This is currently managed by his oncologist.  No medication changes today.  PDMP reviewed and is appropriate.

## 2022-01-20 ENCOUNTER — Ambulatory Visit: Payer: BC Managed Care – PPO | Admitting: Internal Medicine

## 2022-02-02 ENCOUNTER — Other Ambulatory Visit: Payer: Self-pay

## 2022-02-02 ENCOUNTER — Encounter: Payer: Self-pay | Admitting: Internal Medicine

## 2022-02-02 DIAGNOSIS — F988 Other specified behavioral and emotional disorders with onset usually occurring in childhood and adolescence: Secondary | ICD-10-CM

## 2022-02-02 DIAGNOSIS — I10 Essential (primary) hypertension: Secondary | ICD-10-CM

## 2022-02-02 MED ORDER — AMLODIPINE BESYLATE 5 MG PO TABS: 5.0000 mg | ORAL_TABLET | Freq: Every day | ORAL | 2 refills | Status: DC

## 2022-02-03 ENCOUNTER — Ambulatory Visit: Payer: BC Managed Care – PPO | Admitting: Internal Medicine

## 2022-02-15 MED ORDER — METHYLPHENIDATE HCL ER 20 MG PO TBCR: 20.0000 mg | EXTENDED_RELEASE_TABLET | Freq: Every day | ORAL | 0 refills | Status: DC

## 2022-02-15 NOTE — Telephone Encounter (Signed)
Methylphenidate 20 mg ER tablet once daily x #30 tablets refilled today.  PDMP reviewed.  Patient will need to sign controlled substance agreement at his next appointment.

## 2022-03-13 ENCOUNTER — Other Ambulatory Visit: Payer: Self-pay | Admitting: Internal Medicine

## 2022-03-13 DIAGNOSIS — F988 Other specified behavioral and emotional disorders with onset usually occurring in childhood and adolescence: Secondary | ICD-10-CM

## 2022-03-13 MED ORDER — METHYLPHENIDATE HCL ER 20 MG PO TBCR: 20.0000 mg | EXTENDED_RELEASE_TABLET | Freq: Every day | ORAL | 0 refills | Status: DC

## 2022-04-03 ENCOUNTER — Ambulatory Visit: Payer: BC Managed Care – PPO | Admitting: Internal Medicine

## 2022-04-03 ENCOUNTER — Encounter: Payer: Self-pay | Admitting: *Deleted

## 2022-04-03 ENCOUNTER — Encounter: Payer: Self-pay | Admitting: Internal Medicine

## 2022-04-03 VITALS — BP 114/79 | HR 76 | Ht 72.0 in | Wt 228.8 lb

## 2022-04-03 DIAGNOSIS — F320 Major depressive disorder, single episode, mild: Secondary | ICD-10-CM | POA: Diagnosis not present

## 2022-04-03 DIAGNOSIS — J4520 Mild intermittent asthma, uncomplicated: Secondary | ICD-10-CM

## 2022-04-03 DIAGNOSIS — Z1211 Encounter for screening for malignant neoplasm of colon: Secondary | ICD-10-CM

## 2022-04-03 DIAGNOSIS — R5383 Other fatigue: Secondary | ICD-10-CM

## 2022-04-03 DIAGNOSIS — I10 Essential (primary) hypertension: Secondary | ICD-10-CM

## 2022-04-03 DIAGNOSIS — F329 Major depressive disorder, single episode, unspecified: Secondary | ICD-10-CM | POA: Insufficient documentation

## 2022-04-03 DIAGNOSIS — C8330 Diffuse large B-cell lymphoma, unspecified site: Secondary | ICD-10-CM

## 2022-04-03 DIAGNOSIS — R053 Chronic cough: Secondary | ICD-10-CM

## 2022-04-03 MED ORDER — BUDESONIDE-FORMOTEROL FUMARATE 160-4.5 MCG/ACT IN AERO: 2.0000 | INHALATION_SPRAY | Freq: Two times a day (BID) | RESPIRATORY_TRACT | 3 refills | Status: DC

## 2022-04-03 MED ORDER — AMLODIPINE BESYLATE 5 MG PO TABS: 5.0000 mg | ORAL_TABLET | Freq: Every day | ORAL | 1 refills | Status: DC

## 2022-04-03 MED ORDER — SERTRALINE HCL 50 MG PO TABS: 50.0000 mg | ORAL_TABLET | Freq: Every day | ORAL | 3 refills | Status: DC

## 2022-04-03 NOTE — Assessment & Plan Note (Signed)
Gastroenterology referral placed today 

## 2022-04-03 NOTE — Patient Instructions (Signed)
It was a pleasure to see you today.  Thank you for giving [place] the opportunity to be involved in your care.  Below is a brief recap of your visit and next steps.  We will plan to see you again in 2-4 weeks.  Summary Start Zoloft 50 mg daily for depression Trial Symbicort daily for possible asthma Update labs today You have been referred to gastroenterology for colonoscopy Follow up in 2-4 weeks to see how Zoloft is doing

## 2022-04-03 NOTE — Progress Notes (Signed)
Established Patient Office Visit  Subjective   Patient ID: Cole Allen, male    DOB: Jun 19, 1965  Age: 57 y.o. MRN: [id number]  Chief Complaint  Patient presents with   Hypertension    Follow up   Cole Allen returns to care today for follow-up.  He was last seen by me on 01/06/2022 as a new patient presenting to establish care.  His diastolic blood pressure was elevated at that time.  Through shared decision making, HCTZ was discontinued and amlodipine 5 mg daily was initiated.  4-week follow-up was arranged for HTN check.  In the interim, he was seen for oncology for follow-up of a growth on the left temporal region.  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  He has completed 22 rounds of radiation therapy since his last appointment.  The growth has resolved, however today Cole Allen states that dense tissue has developed in the left temporal region again.  He does not know if this is scar tissue, or reflects another recurrence of lymphoma.  He has contacted his oncologist to discuss his concerns.  Cole Allen additional concerns today are a persistent cough, daytime fatigue, and loss of interest in activities that used to bring him joy.  He endorses a several month history of persistent cough and has previously been told that he has asthma.  Symptoms have not significantly improved with as needed use of albuterol.  He is interested in other treatment options today.  Cole Allen and his wife have both noted that he does not have an interest in activities he used to enjoy, such as hunting and fishing.  He sleeps for 2-3 hours during the day each day.  Both Cole Allen and his wife became tearful during this discussion and are concerned that he is depressed.  History reviewed. No pertinent past medical history. Past Surgical History:  Procedure Laterality Date   KIDNEY SURGERY     TONSILLECTOMY     Social History   Tobacco Use   Smoking status: Never    Smokeless tobacco: Never  Vaping Use   Vaping Use: Never used  Substance Use Topics   Alcohol use: No   Drug use: No   History reviewed. No pertinent family history. No Known Allergies  Review of Systems  Respiratory:  Positive for cough (Persistent cough). Negative for sputum production.   Psychiatric/Behavioral:  Positive for depression. Negative for suicidal ideas.   All other systems reviewed and are negative.    Objective:     BP 114/79   Pulse 76   Ht 6' (1.829 m)   Wt 228 lb 12.8 oz (103.8 kg)   SpO2 95%   BMI 31.03 kg/m  BP Readings from Last 3 Encounters:  04/03/22 114/79  01/06/22 (!) 130/92  06/03/21 (!) 145/77   Physical Exam Vitals reviewed.  Constitutional:      General: He is not in acute distress.    Appearance: Normal appearance. He is not ill-appearing.  HENT:     Head: Normocephalic and atraumatic.     Right Ear: External ear normal.     Left Ear: External ear normal.     Nose: Nose normal. No congestion or rhinorrhea.     Mouth/Throat:     Mouth: Mucous membranes are moist.     Pharynx: Oropharynx is clear.  Eyes:     General: No scleral icterus.    Extraocular Movements: Extraocular movements intact.     Conjunctiva/sclera: Conjunctivae normal.  Pupils: Pupils are equal, round, and reactive to light.  Cardiovascular:     Rate and Rhythm: Normal rate and regular rhythm.     Pulses: Normal pulses.     Heart sounds: Normal heart sounds. No murmur heard. Pulmonary:     Effort: Pulmonary effort is normal.     Breath sounds: Normal breath sounds. No wheezing, rhonchi or rales.  Abdominal:     General: Abdomen is flat. Bowel sounds are normal. There is no distension.     Palpations: Abdomen is soft.     Tenderness: There is no abdominal tenderness.  Musculoskeletal:        General: No swelling or deformity. Normal range of motion.     Cervical back: Normal range of motion.     Comments: Dense tissue present over left masticator  Skin:     General: Skin is warm and dry.     Capillary Refill: Capillary refill takes less than 2 seconds.  Neurological:     General: No focal deficit present.     Mental Status: He is alert and oriented to person, place, and time.     Motor: No weakness.  Psychiatric:        Mood and Affect: Mood normal.        Behavior: Behavior normal.        Thought Content: Thought content normal.     Comments: Tearful during today's encounter   Last CBC Lab Results  Component Value Date   WBC 4.8 06/02/2021   HGB 15.3 06/02/2021   HCT 44.7 06/02/2021   MCV 94.7 06/02/2021   MCH 32.4 06/02/2021   RDW 12.8 06/02/2021   PLT 222 [id number]   Last metabolic panel Lab Results  Component Value Date   GLUCOSE 95 06/02/2021   NA 139 06/02/2021   K 3.9 06/02/2021   CL 107 06/02/2021   CO2 25 06/02/2021   BUN 13 06/02/2021   CREATININE 0.95 06/02/2021   GFRNONAA >60 06/02/2021   CALCIUM 9.1 06/02/2021   ANIONGAP 7 06/02/2021     Assessment & Plan:   Problem List Items Addressed This Visit       Essential hypertension    HCTZ was discontinued at his last appointment in favor of amlodipine 5 mg daily.  BP has significantly improved, 114/79 today. -No medication changes today      Diffuse large B-cell lymphoma    He has completed an additional 22 rounds of radiation treatment for recurrence of DLBCL in the left masticator.  Repeat PET scan currently scheduled for June.  He is concerned about dense tissue that has developed in the left temporal region after completing radiation therapy.  He has contacted his oncologist about this and is awaiting a response.      Persistent cough    He endorses a persistent cough that has been present for several months.  He endorses a remote history of COVID-19.  Previously told that he has asthma.  Symptoms have not significantly improved with using albuterol.  He is interested in additional treatment options today. -Trial daily PPI x 2 weeks -Will also add  Symbicort for daily use for presumed diagnosis of asthma      MDD (major depressive disorder)    PHQ-9 score 6.  Patient tearful during today's encounter when discussing recent events.  He is sleeping 2-3 hours during the day and has no interest in activities he used to enjoy, such as hunting and fishing.  His wife is concerned  that he is depressed, and Mr. Desocio acknowledges that he frequently worries about his health.  He is interested in starting an antidepressant. -Zoloft 50 mg daily started today.  We will follow-up in 2-4 weeks through video encounter for reassessment.      Colon cancer screening    Gastroenterology referral placed today      Return in about 2 weeks (around 04/17/2022) for depression.   Johnette Abraham, MD

## 2022-04-03 NOTE — Assessment & Plan Note (Signed)
He has completed an additional 22 rounds of radiation treatment for recurrence of DLBCL in the left masticator.  Repeat PET scan currently scheduled for June.  He is concerned about dense tissue that has developed in the left temporal region after completing radiation therapy.  He has contacted his oncologist about this and is awaiting a response.

## 2022-04-03 NOTE — Assessment & Plan Note (Signed)
HCTZ was discontinued at his last appointment in favor of amlodipine 5 mg daily.  BP has significantly improved, 114/79 today. -No medication changes today

## 2022-04-03 NOTE — Assessment & Plan Note (Signed)
He endorses a persistent cough that has been present for several months.  He endorses a remote history of COVID-19.  Previously told that he has asthma.  Symptoms have not significantly improved with using albuterol.  He is interested in additional treatment options today. -Trial daily PPI x 2 weeks -Will also add Symbicort for daily use for presumed diagnosis of asthma

## 2022-04-03 NOTE — Assessment & Plan Note (Signed)
PHQ-9 score 6.  Patient tearful during today's encounter when discussing recent events.  He is sleeping 2-3 hours during the day and has no interest in activities he used to enjoy, such as hunting and fishing.  His wife is concerned that he is depressed, and Cole Allen acknowledges that he frequently worries about his health.  He is interested in starting an antidepressant. -Zoloft 50 mg daily started today.  We will follow-up in 2-4 weeks through video encounter for reassessment.

## 2022-04-05 LAB — VITAMIN D 25 HYDROXY (VIT D DEFICIENCY, FRACTURES): Vit D, 25-Hydroxy: 28 ng/mL — ABNORMAL LOW (ref 30.0–100.0)

## 2022-04-05 LAB — CBC WITH DIFFERENTIAL/PLATELET
Basophils Absolute: 0 10*3/uL (ref 0.0–0.2)
Basos: 1 %
EOS (ABSOLUTE): 0.3 10*3/uL (ref 0.0–0.4)
Eos: 5 %
Hematocrit: 44.1 % (ref 37.5–51.0)
Hemoglobin: 14.6 g/dL (ref 13.0–17.7)
Immature Grans (Abs): 0 10*3/uL (ref 0.0–0.1)
Immature Granulocytes: 0 %
Lymphocytes Absolute: 0.5 10*3/uL — ABNORMAL LOW (ref 0.7–3.1)
Lymphs: 9 %
MCH: 33.6 pg — ABNORMAL HIGH (ref 26.6–33.0)
MCHC: 33.1 g/dL (ref 31.5–35.7)
MCV: 101 fL — ABNORMAL HIGH (ref 79–97)
Monocytes Absolute: 0.7 10*3/uL (ref 0.1–0.9)
Monocytes: 12 %
Neutrophils Absolute: 4.2 10*3/uL (ref 1.4–7.0)
Neutrophils: 73 %
Platelets: 201 10*3/uL (ref 150–450)
RBC: 4.35 x10E6/uL (ref 4.14–5.80)
RDW: 13.6 % (ref 11.6–15.4)
WBC: 5.7 10*3/uL (ref 3.4–10.8)

## 2022-04-05 LAB — TSH+FREE T4
Free T4: 1.48 ng/dL (ref 0.82–1.77)
TSH: 1.37 u[IU]/mL (ref 0.450–4.500)

## 2022-04-05 LAB — HCV INTERPRETATION

## 2022-04-05 LAB — B12 AND FOLATE PANEL
Folate: 8 ng/mL (ref >3.0)
Vitamin B-12: 971 pg/mL (ref 232–1245)

## 2022-04-05 LAB — HEMOGLOBIN A1C
Est. average glucose Bld gHb Est-mCnc: 100 mg/dL
Hgb A1c MFr Bld: 5.1 % (ref 4.8–5.6)

## 2022-04-05 LAB — CMP14+EGFR
ALT: 24 IU/L (ref 0–44)
AST: 20 IU/L (ref 0–40)
Albumin/Globulin Ratio: 3 — ABNORMAL HIGH (ref 1.2–2.2)
Albumin: 4.8 g/dL (ref 3.8–4.9)
Alkaline Phosphatase: 89 IU/L (ref 44–121)
BUN/Creatinine Ratio: 14 (ref 9–20)
BUN: 14 mg/dL (ref 6–24)
Bilirubin Total: 0.4 mg/dL (ref 0.0–1.2)
CO2: 22 mmol/L (ref 20–29)
Calcium: 9.3 mg/dL (ref 8.7–10.2)
Chloride: 104 mmol/L (ref 96–106)
Creatinine, Ser: 1.01 mg/dL (ref 0.76–1.27)
Globulin, Total: 1.6 g/dL (ref 1.5–4.5)
Glucose: 87 mg/dL (ref 70–99)
Potassium: 4.3 mmol/L (ref 3.5–5.2)
Sodium: 143 mmol/L (ref 134–144)
Total Protein: 6.4 g/dL (ref 6.0–8.5)
eGFR: 88 mL/min/{1.73_m2} (ref >59)

## 2022-04-05 LAB — LIPID PANEL
Chol/HDL Ratio: 6.1 ratio — ABNORMAL HIGH (ref 0.0–5.0)
Cholesterol, Total: 172 mg/dL (ref 100–199)
HDL: 28 mg/dL — ABNORMAL LOW (ref >39)
LDL Chol Calc (NIH): 126 mg/dL — ABNORMAL HIGH (ref 0–99)
Triglycerides: 94 mg/dL (ref 0–149)
VLDL Cholesterol Cal: 18 mg/dL (ref 5–40)

## 2022-04-05 LAB — HCV AB W REFLEX TO QUANT PCR: HCV Ab: NONREACTIVE

## 2022-04-05 LAB — IRON,TIBC AND FERRITIN PANEL
Ferritin: 356 ng/mL (ref 30–400)
Iron Saturation: 26 % (ref 15–55)
Iron: 68 ug/dL (ref 38–169)
Total Iron Binding Capacity: 263 ug/dL (ref 250–450)
UIBC: 195 ug/dL (ref 111–343)

## 2022-04-05 LAB — HIV ANTIBODY (ROUTINE TESTING W REFLEX): HIV Screen 4th Generation wRfx: NONREACTIVE

## 2022-04-07 ENCOUNTER — Encounter: Payer: Self-pay | Admitting: Family Medicine

## 2022-04-07 ENCOUNTER — Telehealth (INDEPENDENT_AMBULATORY_CARE_PROVIDER_SITE_OTHER): Payer: BC Managed Care – PPO | Admitting: Family Medicine

## 2022-04-07 ENCOUNTER — Encounter: Payer: Self-pay | Admitting: Internal Medicine

## 2022-04-07 DIAGNOSIS — J329 Chronic sinusitis, unspecified: Secondary | ICD-10-CM

## 2022-04-07 MED ORDER — AZITHROMYCIN 250 MG PO TABS: ORAL_TABLET | ORAL | 0 refills | Status: AC

## 2022-04-07 MED ORDER — PROMETHAZINE-DM 6.25-15 MG/5ML PO SYRP: 5.0000 mL | ORAL_SOLUTION | Freq: Four times a day (QID) | ORAL | 0 refills | Status: DC | PRN

## 2022-04-07 NOTE — Progress Notes (Signed)
Virtual Visit via Video Note  I connected with Cole RasmussenAllen R Allen on 04/07/22 at 11:00 AM EDT by a video enabled telemedicine application and verified that I am speaking with the correct person using two identifiers.  Patient Location: Home Provider Location: Office/Clinic  I discussed the limitations, risks, security, and privacy concerns of performing an evaluation and management service by video and the availability of in person appointments. I also discussed with the patient that there may be a patient responsible charge related to this service. The patient expressed understanding and agreed to proceed.  Subjective: PCP: Billie Ladeixon, Phillip E, MD  Chief Complaint  Patient presents with   Cough    Pt reports sx of cough, green/ yellow mucus, has been using inhaler that is breaking up congestion in chest.    HPI The patient is seen today with complaints of persistent cough, yellow-green phlegm, nasal congestion, rhinorrhea, chills, and feeling feverish. Denies body aches and headaches. Denies sore throat. Complains of fatigue since his radiation therapy three weeks ago--negative COVID test. No recent sick contact was reported.  ROS: Per HPI  Current Outpatient Medications:    acyclovir (ZOVIRAX) 400 MG tablet, Take 1 tablet (400 mg) by mouth two times per day.  Begin with LD therapy and continue for at least 486-months post CAR-T cell infusion AND until CD4 > 200., Disp: , Rfl:    amLODipine (NORVASC) 5 MG tablet, Take 1 tablet (5 mg total) by mouth daily., Disp: 90 tablet, Rfl: 1   azithromycin (ZITHROMAX) 250 MG tablet, Take 2 tablets on day 1, then 1 tablet daily on days 2 through 5, Disp: 6 tablet, Rfl: 0   budesonide-formoterol (SYMBICORT) 160-4.5 MCG/ACT inhaler, Inhale 2 puffs into the lungs 2 (two) times daily., Disp: 1 each, Rfl: 3   methylphenidate (METADATE ER) 20 MG ER tablet, Take 1 tablet (20 mg total) by mouth daily for 30 doses., Disp: 30 tablet, Rfl: 0    promethazine-dextromethorphan (PROMETHAZINE-DM) 6.25-15 MG/5ML syrup, Take 5 mLs by mouth 4 (four) times daily as needed., Disp: 118 mL, Rfl: 0   sertraline (ZOLOFT) 50 MG tablet, Take 1 tablet (50 mg total) by mouth daily., Disp: 30 tablet, Rfl: 3  Observations/Objective: There were no vitals filed for this visit. Physical Exam Patient is well-developed, well-nourished in no acute distress.  Resting comfortably at home.  Head is normocephalic, atraumatic.  No labored breathing.  Speech is clear and coherent with logical content.  Patient is alert and oriented at baseline.   Assessment and Plan: Other sinusitis, unspecified chronicity -     Promethazine-DM; Take 5 mLs by mouth 4 (four) times daily as needed.  Dispense: 118 mL; Refill: 0 -     Azithromycin; Take 2 tablets on day 1, then 1 tablet daily on days 2 through 5  Dispense: 6 tablet; Refill: 0  Take medication as prescribed. Increase fluids and allow for plenty of rest. Recommend using a humidifier at bedtime during sleep to help with cough and nasal congestion.   Follow Up Instructions: No follow-ups on file.  Follow-up if your symptoms do not improve   I discussed the assessment and treatment plan with the patient. The patient was provided an opportunity to ask questions, and all were answered. The patient agreed with the plan and demonstrated an understanding of the instructions.   The patient was advised to call back or seek an in-person evaluation if the symptoms worsen or if the condition fails to improve as anticipated.  The above  assessment and management plan was discussed with the patient. The patient verbalized understanding of and has agreed to the management plan.   Gilmore Laroche, FNPWith

## 2022-04-07 NOTE — Telephone Encounter (Signed)
scheduled

## 2022-04-24 ENCOUNTER — Encounter: Payer: Self-pay | Admitting: Internal Medicine

## 2022-04-24 ENCOUNTER — Telehealth (INDEPENDENT_AMBULATORY_CARE_PROVIDER_SITE_OTHER): Payer: BC Managed Care – PPO | Admitting: Internal Medicine

## 2022-04-24 DIAGNOSIS — F324 Major depressive disorder, single episode, in partial remission: Secondary | ICD-10-CM

## 2022-04-24 DIAGNOSIS — R053 Chronic cough: Secondary | ICD-10-CM | POA: Diagnosis not present

## 2022-04-24 NOTE — Progress Notes (Signed)
Virtual Visit via Video Note  I connected with Cole Allen on 04/24/22 at 11:00 AM EDT by a video enabled telemedicine application and verified that I am speaking with the correct person using two identifiers.  Patient Location: Home Provider Location: Office/Clinic  I discussed the limitations, risks, security, and privacy concerns of performing an evaluation and management service by video and the availability of in person appointments. I also discussed with the patient that there may be a patient responsible charge related to this service. The patient expressed understanding and agreed to proceed.  Subjective: PCP: Billie Lade, MD  Chief Complaint  Patient presents with   Depression    Follow up   Mr. Cole Allen has been evaluated today for follow-up through video encounter.  He was last seen by me on 4/1 at which time Zoloft 50 mg daily was started for treatment of MDD.  Symbicort was also added for presumed asthma in the setting of a persistent cough.  In the interim, he was evaluated on 4/5 for symptoms of sinusitis and was treated with a Z-Pak and promethazine-DM.  There have otherwise been no acute interval events.  Mr. Cole Allen states that he feels better today.  Zoloft has been effective in stabilizing his mood.  His cough has improved with Symbicort.  He was evaluated by radiation oncology today and will undergo a repeat CT scan early next month (5/3).  He also has a PET scan scheduled for June.  He has no additional concerns to discuss today.  ROS: Per HPI  Current Outpatient Medications:    acyclovir (ZOVIRAX) 400 MG tablet, Take 1 tablet (400 mg) by mouth two times per day.  Begin with LD therapy and continue for at least 52-months post CAR-T cell infusion AND until CD4 > 200., Disp: , Rfl:    amLODipine (NORVASC) 5 MG tablet, Take 1 tablet (5 mg total) by mouth daily., Disp: 90 tablet, Rfl: 1   budesonide-formoterol (SYMBICORT) 160-4.5 MCG/ACT inhaler, Inhale 2 puffs  into the lungs 2 (two) times daily., Disp: 1 each, Rfl: 3   methylphenidate (METADATE ER) 20 MG ER tablet, Take 1 tablet (20 mg total) by mouth daily for 30 doses., Disp: 30 tablet, Rfl: 0   promethazine-dextromethorphan (PROMETHAZINE-DM) 6.25-15 MG/5ML syrup, Take 5 mLs by mouth 4 (four) times daily as needed., Disp: 118 mL, Rfl: 0   sertraline (ZOLOFT) 50 MG tablet, Take 1 tablet (50 mg total) by mouth daily., Disp: 30 tablet, Rfl: 3  Assessment and Plan:  Major depressive disorder with single episode, in partial remission Assessment & Plan: Zoloft 50 mg daily was started at his last appointment for treatment of MDD.  He states that this has been beneficial in stabilizing his mood.  He denies SI/HI.  He is not interested in making any additional medication adjustments today. -No medication changes today.  Continue Zoloft 50 mg daily. -Follow-up in 3 months for reassessment through virtual encounter.  Persistent cough Assessment & Plan: Secondary to presumed asthma.  Symptoms have improved with daily use of Symbicort. -No medication changes today  Follow Up Instructions: Return in about 3 months (around 07/24/2022) for MDD.   I discussed the assessment and treatment plan with the patient. The patient was provided an opportunity to ask questions, and all were answered. The patient agreed with the plan and demonstrated an understanding of the instructions.   The patient was advised to call back or seek an in-person evaluation if the symptoms worsen or if the  condition fails to improve as anticipated.  The above assessment and management plan was discussed with the patient. The patient verbalized understanding of and has agreed to the management plan.   Johnette Abraham, MD

## 2022-04-24 NOTE — Assessment & Plan Note (Addendum)
Zoloft 50 mg daily was started at his last appointment for treatment of MDD.  He states that this has been beneficial in stabilizing his mood.  He denies SI/HI.  He is not interested in making any additional medication adjustments today. -No medication changes today.  Continue Zoloft 50 mg daily. -Follow-up in 3 months for reassessment through virtual encounter.

## 2022-04-24 NOTE — Assessment & Plan Note (Signed)
Secondary to presumed asthma.  Symptoms have improved with daily use of Symbicort. -No medication changes today

## 2022-05-04 ENCOUNTER — Encounter: Payer: Self-pay | Admitting: Internal Medicine

## 2022-05-15 ENCOUNTER — Other Ambulatory Visit: Payer: Self-pay | Admitting: Internal Medicine

## 2022-05-15 DIAGNOSIS — F988 Other specified behavioral and emotional disorders with onset usually occurring in childhood and adolescence: Secondary | ICD-10-CM

## 2022-05-15 MED ORDER — METHYLPHENIDATE HCL ER 20 MG PO TBCR
20.0000 mg | EXTENDED_RELEASE_TABLET | Freq: Every day | ORAL | 0 refills | Status: DC
Start: 2022-05-15 — End: 2022-06-13

## 2022-06-13 ENCOUNTER — Other Ambulatory Visit: Payer: Self-pay | Admitting: Internal Medicine

## 2022-06-13 DIAGNOSIS — F988 Other specified behavioral and emotional disorders with onset usually occurring in childhood and adolescence: Secondary | ICD-10-CM

## 2022-06-13 MED ORDER — METHYLPHENIDATE HCL ER 20 MG PO TBCR
20.0000 mg | EXTENDED_RELEASE_TABLET | Freq: Every day | ORAL | 0 refills | Status: DC
Start: 2022-06-13 — End: 2022-07-05

## 2022-07-05 ENCOUNTER — Other Ambulatory Visit: Payer: Self-pay | Admitting: Internal Medicine

## 2022-07-05 DIAGNOSIS — F988 Other specified behavioral and emotional disorders with onset usually occurring in childhood and adolescence: Secondary | ICD-10-CM

## 2022-07-05 MED ORDER — METHYLPHENIDATE HCL ER 20 MG PO TBCR
20.0000 mg | EXTENDED_RELEASE_TABLET | Freq: Every day | ORAL | 0 refills | Status: DC
Start: 2022-07-05 — End: 2022-08-18

## 2022-07-24 ENCOUNTER — Telehealth: Payer: BC Managed Care – PPO | Admitting: Internal Medicine

## 2022-07-24 ENCOUNTER — Encounter: Payer: Self-pay | Admitting: Internal Medicine

## 2022-07-25 ENCOUNTER — Encounter: Payer: Self-pay | Admitting: Internal Medicine

## 2022-08-02 ENCOUNTER — Telehealth: Payer: Self-pay

## 2022-08-02 NOTE — Transitions of Care (Post Inpatient/ED Visit) (Signed)
   08/02/2022  Name: Cole Allen MRN: [id number] DOB: 12-Feb-1965  Today's TOC FU Call Status: Today's TOC FU Call Status:: Successful TOC FU Call Competed TOC FU Call Complete Date: 08/02/22  Transition Care Management Follow-up Telephone Call Date of Discharge: 08/01/22 Discharge Facility[facility] Other ([facility]) Name of [facility] ([facility]charge Facility: [facility] Type of Discharge: Inpatient Admission Primary Inpatient Discharge Diagnosis:: agranulocytosis How have you been since you were released from the hospital?: Better Any questions or concerns?: No  Items Reviewed: Did you receive and understand the discharge instructions provided?: Yes Medications obtained,verified, and reconciled?: Yes (Medications Reviewed) Any new allergies since your discharge?: No Dietary orders reviewed?: Yes Do you have support at home?: Yes People in Home: spouse  Medications Reviewed Today: Medications Reviewed Today     Reviewed by Karena Addison, LPN (Licensed Practical Nurse) on 08/02/22 at [phone]  Med List Status: <None>   Medication Order Taking? Sig Documenting Provider Last Dose Status Informant  acyclovir (ZOVIRAX) 400 MG tablet [id number] No Take 1 tablet (400 mg) by mouth two times per day.  Begin with LD therapy and continue for at least 80-months post CAR-T cell infusion AND until CD4 > 200. [provider] Taking Active   amLODipine (NORVASC) 5 MG tablet [id number] No Take 1 tablet (5 mg total) by mouth daily. Billie Lade, MD Taking Active   budesonide-formoterol [facility]) 160-4.5 MCG/ACT inhaler [id number] No Inhale 2 puffs into the lungs 2 (two) times daily. Billie Lade, MD Taking Active   methylphenidate (METADATE ER) 20 MG ER tablet [id number]  Take 1 tablet (20 mg total) by mouth daily for 30 doses. Billie Lade, MD  Active   promethazine-dextromethorphan (PROMETHAZINE-DM) 6.25-15 MG/5ML syrup [id number] No Take 5 mLs by mouth 4 (four) times daily as  needed. Gilmore Laroche, FNP Taking Active   sertraline (ZOLOFT) 50 MG tablet [id number] No Take 1 tablet (50 mg total) by mouth daily. Billie Lade, MD Taking Active             Home Care and Equipment/Supplies: Were Home Health Services Ordered?: NA Any new equipment or medical supplies ordered?: NA  Functional Questionnaire: Do you need assistance with bathing/showering or dressing?: No Do you need assistance with meal preparation?: No Do you need assistance with eating?: No Do you have difficulty maintaining continence: No Do you need assistance with getting out of bed/getting out of a chair/moving?: No Do you have difficulty managing or taking your medications?: No  Follow up appointments reviewed: PCP Follow-up appointment confirmed?: No (will call back to schedule) MD Provider Line Number:[phone] Given: No Specialist Hospital Follow-up appointment confirmed?: Yes Date of Specialist follow-up appointment?: 08/07/22 Follow-Up Specialty Provider:: [facility] Do you need transportation to your follow-up appointment?: No Do you understand care options if your condition(s) worsen?: Yes-patient verbalized understanding    SIGNATURE Karena Addison, LPN [facility] Nurse Health Advisor Direct Dial [phone]

## 2022-08-03 ENCOUNTER — Encounter: Payer: Self-pay | Admitting: Internal Medicine

## 2022-08-08 ENCOUNTER — Encounter: Payer: Self-pay | Admitting: Internal Medicine

## 2022-08-08 ENCOUNTER — Other Ambulatory Visit: Payer: Self-pay

## 2022-08-08 DIAGNOSIS — F320 Major depressive disorder, single episode, mild: Secondary | ICD-10-CM

## 2022-08-08 MED ORDER — SERTRALINE HCL 50 MG PO TABS
50.0000 mg | ORAL_TABLET | Freq: Every day | ORAL | 3 refills | Status: DC
Start: 2022-08-08 — End: 2022-12-04

## 2022-08-18 ENCOUNTER — Encounter: Payer: Self-pay | Admitting: Internal Medicine

## 2022-08-18 DIAGNOSIS — F988 Other specified behavioral and emotional disorders with onset usually occurring in childhood and adolescence: Secondary | ICD-10-CM

## 2022-08-18 MED ORDER — METHYLPHENIDATE HCL ER 20 MG PO TBCR
20.0000 mg | EXTENDED_RELEASE_TABLET | Freq: Every day | ORAL | 0 refills | Status: DC
Start: 2022-08-18 — End: 2022-09-19

## 2022-08-21 ENCOUNTER — Telehealth: Payer: Self-pay

## 2022-08-21 NOTE — Transitions of Care (Post Inpatient/ED Visit) (Unsigned)
   08/21/2022  Name: Cole Allen MRN: [id number] DOB: 1965/03/06  Today's TOC FU Call Status: Today's TOC FU Call Status:: Unsuccessful Call (1st Attempt) Unsuccessful Call (1st Attempt) Date: 08/21/22  Attempted to reach the patient regarding the most recent Inpatient/ED visit.  Follow Up Plan: Additional outreach attempts will be made to reach the patient to complete the Transitions of Care (Post Inpatient/ED visit) call.   Signature Karena Addison, LPN [facility] Nurse Health Advisor Direct Dial [phone]

## 2022-08-22 NOTE — Transitions of Care (Post Inpatient/ED Visit) (Signed)
   08/22/2022  Name: Cole Allen MRN: [id number] DOB: 1965/01/30  Today's TOC FU Call Status: Today's TOC FU Call Status:: Successful TOC FU Call Completed Unsuccessful Call (1st Attempt) Date: 08/21/22 [facility] FU Call Complete Date: 08/22/22  Transition Care Management Follow-up Telephone Call Date of Discharge: 08/20/22 Discharge Facility: Other ([facility]) Name of [facility]charge Facility: [facility] Type of Discharge: Inpatient Admission Primary Inpatient Discharge Diagnosis:: neutropenia How have you been since you were released from the hospital?: Better Any questions or concerns?: No  Items Reviewed: Did you receive and understand the discharge instructions provided?: Yes Medications obtained,verified, and reconciled?: Yes (Medications Reviewed) Any new allergies since your discharge?: No Dietary orders reviewed?: Yes Do you have support at home?: Yes People in Home: spouse  Medications Reviewed Today: Medications Reviewed Today     Reviewed by Karena Addison, LPN (Licensed Practical Nurse) on 08/22/22 at 1641  Med List Status: <None>   Medication Order Taking? Sig Documenting Provider Last Dose Status Informant  acyclovir (ZOVIRAX) 400 MG tablet [id number] No Take 1 tablet (400 mg) by mouth two times per day.  Begin with LD therapy and continue for at least 74-months post CAR-T cell infusion AND until CD4 > 200. [provider] Taking Active   amLODipine (NORVASC) 5 MG tablet [id number] No Take 1 tablet (5 mg total) by mouth daily. Billie Lade, MD Taking Active   budesonide-formoterol [facility]) 160-4.5 MCG/ACT inhaler [id number] No Inhale 2 puffs into the lungs 2 (two) times daily. Billie Lade, MD Taking Active   methylphenidate (METADATE ER) 20 MG ER tablet [id number]  Take 1 tablet (20 mg total) by mouth daily for 30 doses. Billie Lade, MD  Active   promethazine-dextromethorphan (PROMETHAZINE-DM) 6.25-15 MG/5ML syrup [id number] No Take  5 mLs by mouth 4 (four) times daily as needed. Gilmore Laroche, FNP Taking Active   sertraline (ZOLOFT) 50 MG tablet [id number]  Take 1 tablet (50 mg total) by mouth daily. Billie Lade, MD  Active             Home Care and Equipment/Supplies: Were Home Health Services Ordered?: NA Any new equipment or medical supplies ordered?: NA  Functional Questionnaire: Do you need assistance with bathing/showering or dressing?: No Do you need assistance with meal preparation?: No Do you need assistance with eating?: No Do you have difficulty maintaining continence: No Do you need assistance with getting out of bed/getting out of a chair/moving?: No Do you have difficulty managing or taking your medications?: No  Follow up appointments reviewed: PCP Follow-up appointment confirmed?: NA Specialist Hospital Follow-up appointment confirmed?: Yes Date of Specialist follow-up appointment?: 08/28/22 Follow-Up Specialty Provider:: [facility] Do you need transportation to your follow-up appointment?: No Do you understand care options if your condition(s) worsen?: Yes-patient verbalized understanding    SIGNATURE Karena Addison, LPN [facility] Nurse Health Advisor Direct Dial [phone]

## 2022-08-23 ENCOUNTER — Telehealth: Payer: Self-pay

## 2022-08-23 NOTE — Transitions of Care (Post Inpatient/ED Visit) (Signed)
   08/23/2022  Name: Cole Allen MRN: [id number] DOB: [birth year]  Today's TOC FU Call Status: Today's TOC FU Call Status:: Successful TOC FU Call Completed TOC FU Call Complete Date: 08/23/22  Transition Care Management Follow-up Telephone Call Date of Discharge: 08/22/22 Discharge Facility: Other ([facility]) Name of [facility] ([facility]) [facility]charge Facility: [facility] Type of Discharge: Inpatient Admission Primary Inpatient Discharge Diagnosis:: lymphoma How have you been since you were released from the hospital?: Better Any questions or concerns?: No  Items Reviewed: Did you receive and understand the discharge instructions provided?: Yes Medications obtained,verified, and reconciled?: Yes (Medications Reviewed) Any new allergies since your discharge?: No Dietary orders reviewed?: Yes Do you have support at home?: Yes People in Home: spouse  Medications Reviewed Today: Medications Reviewed Today     Reviewed by Karena Addison, LPN (Licensed Practical Nurse) on 08/23/22 at 1112  Med List Status: <None>   Medication Order Taking? Sig Documenting Provider Last Dose Status Informant  acyclovir (ZOVIRAX) 400 MG tablet [id number] No Take 1 tablet (400 mg) by mouth two times per day.  Begin with LD therapy and continue for at least 68-months post CAR-T cell infusion AND until CD4 > 200. [provider] Taking Active   amLODipine (NORVASC) 5 MG tablet [id number] No Take 1 tablet (5 mg total) by mouth daily. Billie Lade, MD Taking Active   budesonide-formoterol [facility]) 160-4.5 MCG/ACT inhaler [id number] No Inhale 2 puffs into the lungs 2 (two) times daily. Billie Lade, MD Taking Active   methylphenidate (METADATE ER) 20 MG ER tablet [id number]  Take 1 tablet (20 mg total) by mouth daily for 30 doses. Billie Lade, MD  Active   promethazine-dextromethorphan (PROMETHAZINE-DM) 6.25-15 MG/5ML syrup [id number] No Take 5 mLs by mouth 4 (four) times daily as needed.  Gilmore Laroche, FNP Taking Active   sertraline (ZOLOFT) 50 MG tablet [id number]  Take 1 tablet (50 mg total) by mouth daily. Billie Lade, MD  Active             Home Care and Equipment/Supplies: Were Home Health Services Ordered?: NA Any new equipment or medical supplies ordered?: NA  Functional Questionnaire: Do you need assistance with bathing/showering or dressing?: No Do you need assistance with meal preparation?: No Do you need assistance with eating?: No Do you have difficulty maintaining continence: No Do you need assistance with getting out of bed/getting out of a chair/moving?: No Do you have difficulty managing or taking your medications?: No  Follow up appointments reviewed: PCP Follow-up appointment confirmed?: NA Specialist Hospital Follow-up appointment confirmed?: Yes Date of Specialist follow-up appointment?: 08/28/22 Follow-Up Specialty Provider:: onco Do you need transportation to your follow-up appointment?: No Do you understand care options if your condition(s) worsen?: Yes-patient verbalized understanding    SIGNATURE Karena Addison, LPN [facility] Nurse Health Advisor Direct Dial [phone]

## 2022-09-19 ENCOUNTER — Other Ambulatory Visit: Payer: Self-pay | Admitting: Internal Medicine

## 2022-09-19 DIAGNOSIS — F988 Other specified behavioral and emotional disorders with onset usually occurring in childhood and adolescence: Secondary | ICD-10-CM

## 2022-09-19 IMAGING — CT CT MAXILLOFACIAL W/ CM
3 series · 14 of 47 positions shown, 16 images · IV contrast (APPLIED)
Comparison: None available.

CLINICAL DATA: Initial evaluation for maxillary/facial abscess,
soft tissue swelling, lump of left-sided neck. History of lymphoma.

EXAM:
CT NECK WITH CONTRAST
TECHNIQUE: Multidetector CT imaging of the neck was performed using the
standard protocol following the bolus administration of intravenous
contrast.

[Series 3: facial/orbits w 2.0 st · axial · 0.39mm/px · z∈[-238,-86]mm · 8 of 90 slices shown, 10 images]
[im 7/90  brain]
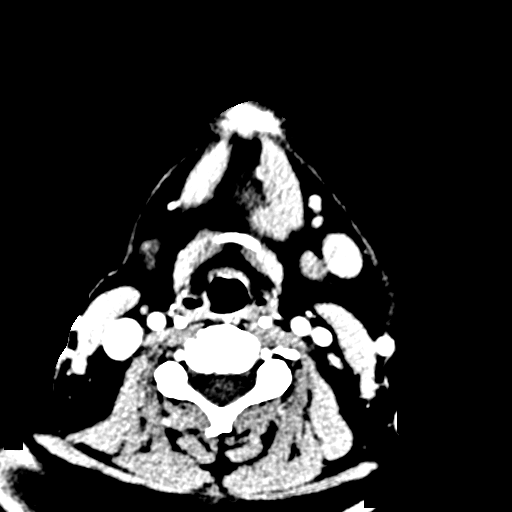
[im 7/90  bone]
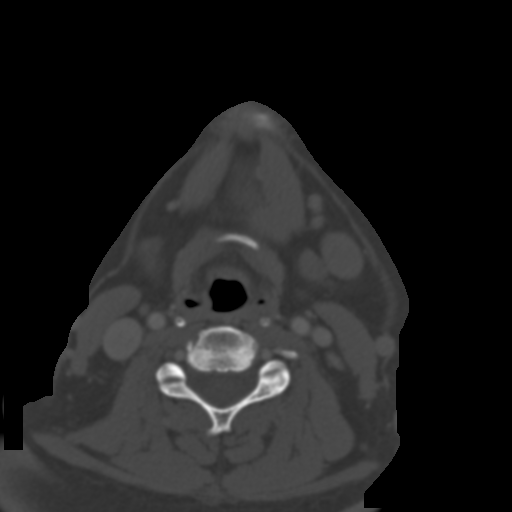
[im 19/90  bone]
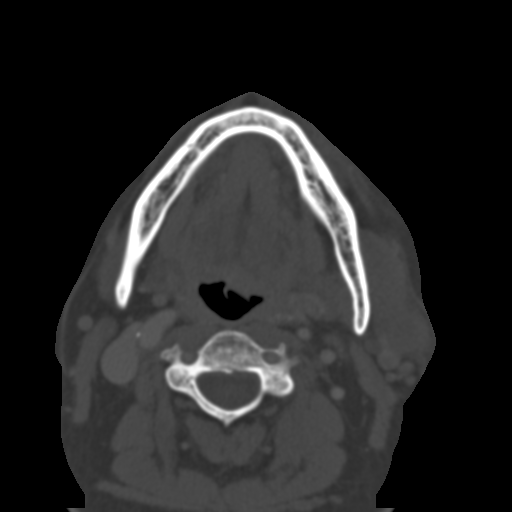
[im 28/90  bone]
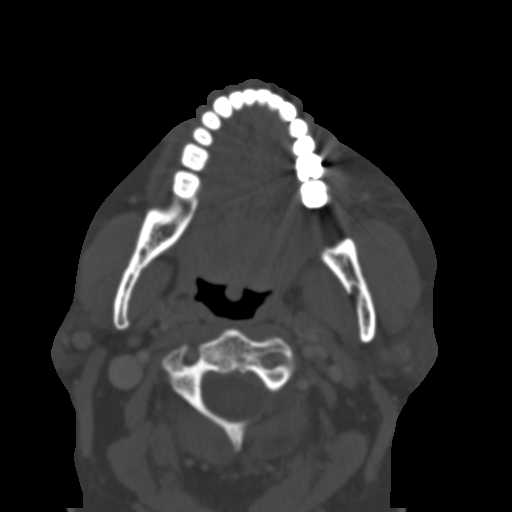
[im 40/90  bone]
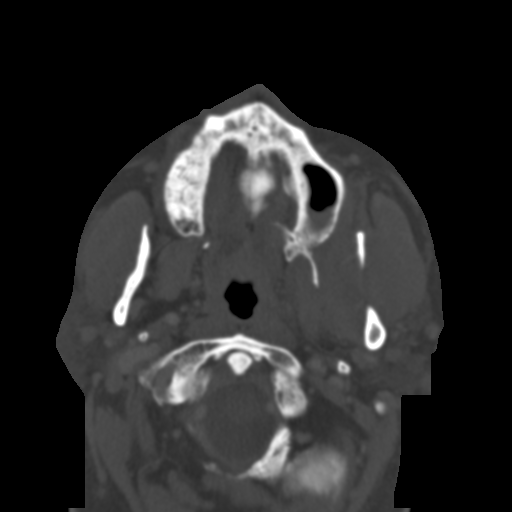
[im 50/90  brain]
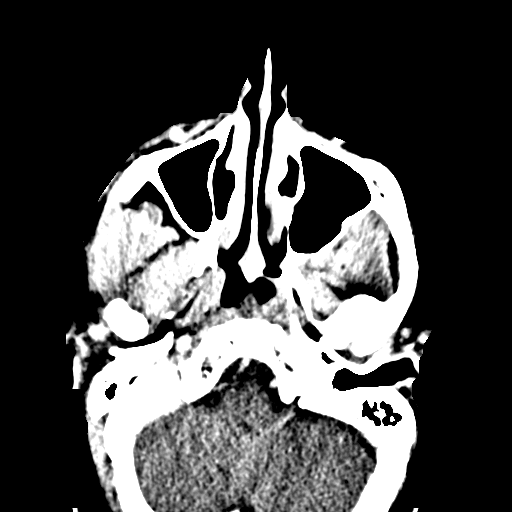
[im 50/90  bone]
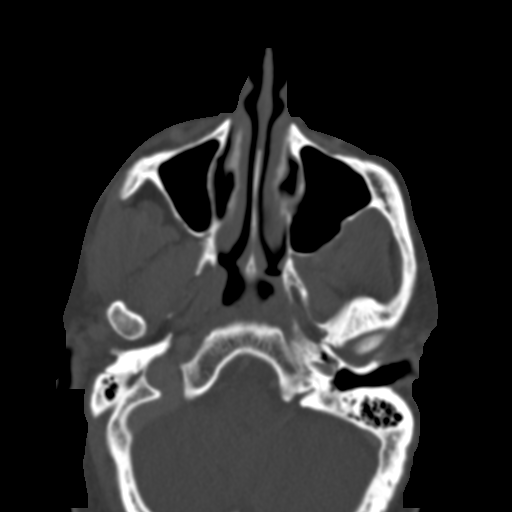
[im 62/90  bone]
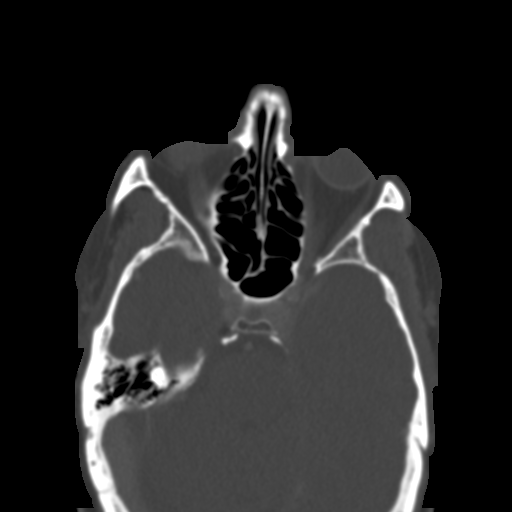
[im 71/90  bone]
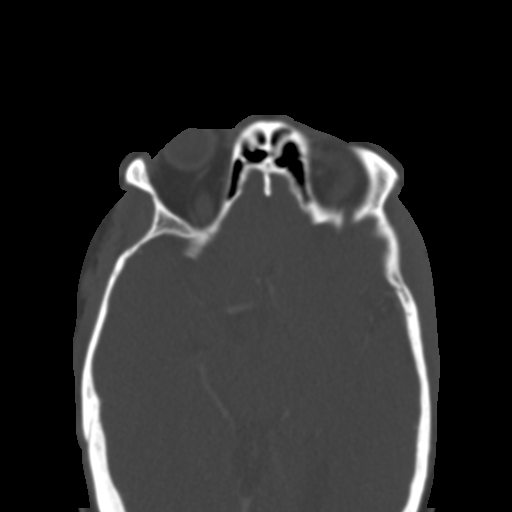
[im 83/90  bone]
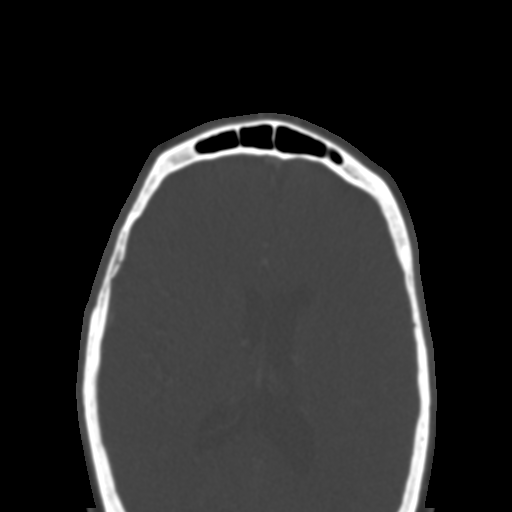

[Series 7: coronal soft tissue · coronal · 0.39mm/px · 3 of 84 slices shown]
[im 28/84  bone]
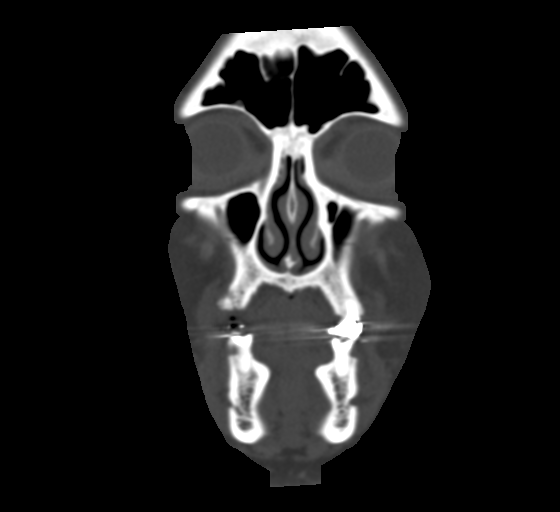
[im 37/84  bone]
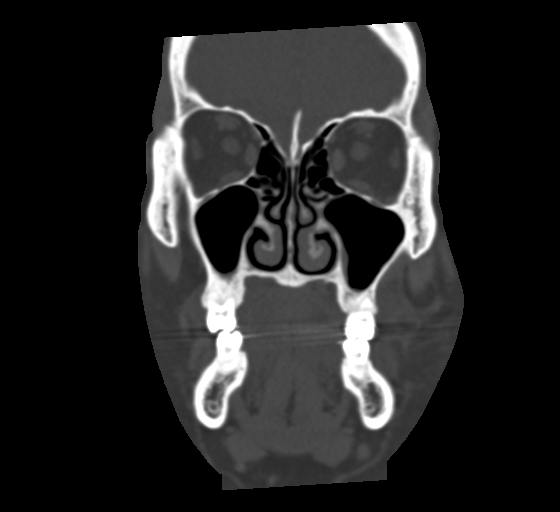
[im 47/84  bone]
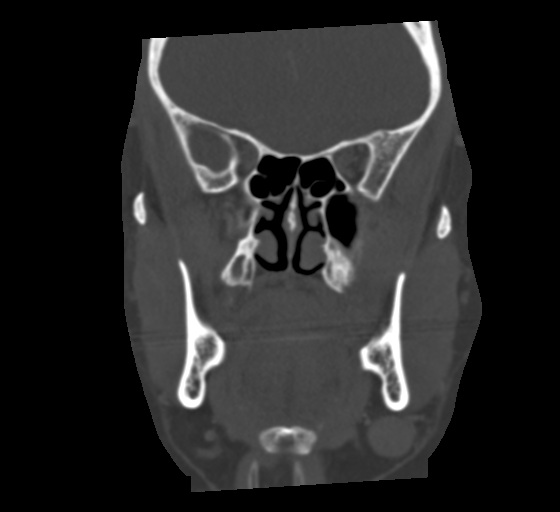

[Series 8: sagittal soft tissue · sagittal · 0.38mm/px · 3 of 89 slices shown]
[im 30/89  bone]
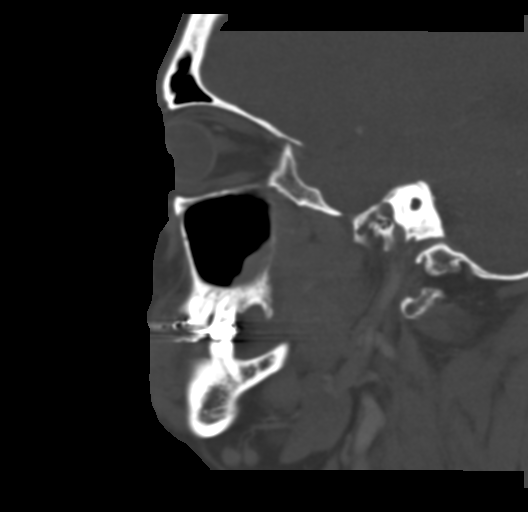
[im 45/89  bone]
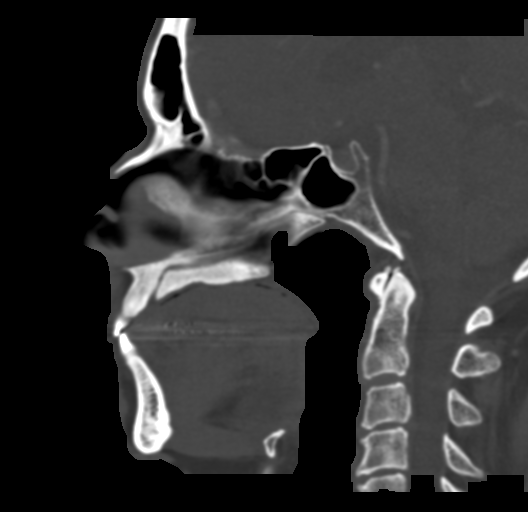
[im 59/89  bone]
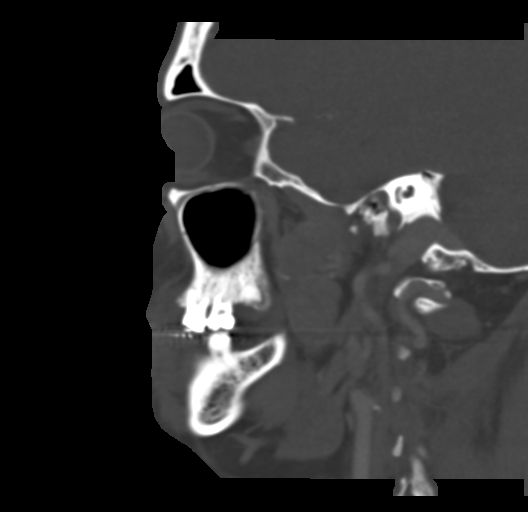

[14 of 47 positions shown; findings below may reference images not displayed]

RADIATION DOSE REDUCTION: This exam was performed according to the
departmental dose-optimization program which includes automated
exposure control, adjustment of the mA and/or kV according to
patient size and/or use of iterative reconstruction technique.

CONTRAST:  75mL OMNIPAQUE IOHEXOL 300 MG/ML  SOLN
FINDINGS: Pharynx and larynx: Oral cavity within normal limits. No acute
inflammatory changes about the dentition. Oropharynx and nasopharynx
within normal limits. No retropharyngeal collection or swelling.
Epiglottis within normal limits. Vallecula clear. Hypopharynx and
supraglottic larynx within normal limits. Glottis normal. Subglottic
airway patent clear.

Salivary glands: Right parotid and submandibular glands are within
normal limits. 1.9 cm rounded nodular lesion at the inferior left
parotid gland, favored to reflect an enlarged lymph node (series 3,
image 40). Central hypodensity suggestive of necrosis.

Thyroid: Normal.

Lymph nodes: Few enlarged left level IB nodes measure up to 1.6 cm
in short axis (series 3, images 49, 46). Note again made of a 1.9 cm
nodular lesion at the inferior aspect of the parotid gland, favored
to reflect an enlarged intraparotid lymph node, although it
concomitant salivary lesion could be considered. Central hypodensity
suggestive of necrosis. Asymmetric prominence of additional
subcentimeter left-sided cervical lymph nodes noted elsewhere within
the neck. Given the patient history of lymphoma, findings are most
concerning for lymphoproliferative disorder.

Vascular: Normal intravascular enhancement seen throughout the neck.
Left-sided Port-A-Cath in place. Mild atherosclerotic change about
the aortic arch and carotid bifurcations.

Limited intracranial: Unremarkable.

Visualized orbits: Unremarkable.

Mastoids and visualized paranasal sinuses: Mild mucosal thickening
within the left greater than right maxillary sinuses. Paranasal
sinuses are otherwise clear. Mastoid air cells and middle ear
cavities are well pneumatized and free of fluid.

Skeleton: No discrete or worrisome osseous lesions. Mild for age
cervical spondylosis.

Upper chest: Visualized upper chest demonstrates no acute finding.

Other: None.
IMPRESSION: 1. Multiple enlarged upper left level Ib cervical lymph nodes
measuring up to 1.6 cm in short axis, with asymmetric prominence of
additional subcentimeter left-sided cervical lymph nodes elsewhere
within the neck. Given the patient history of lymphoma, findings are
most concerning for lymphoproliferative disorder.
2. Additional 1.9 cm nodular lesion at the inferior aspect of the
parotid gland, favored to reflect an enlarged intraparotid lymph
node, although it concomitant salivary lesion could be considered.
Central hypodensity within this lesion suggestive of necrosis.
3. No other acute abnormality within the face/neck.

## 2022-09-19 MED ORDER — METHYLPHENIDATE HCL ER 20 MG PO TBCR
20.0000 mg | EXTENDED_RELEASE_TABLET | Freq: Every day | ORAL | 0 refills | Status: DC
Start: 2022-09-19 — End: 2022-10-18

## 2022-09-21 ENCOUNTER — Other Ambulatory Visit: Payer: Self-pay | Admitting: Internal Medicine

## 2022-09-21 DIAGNOSIS — I10 Essential (primary) hypertension: Secondary | ICD-10-CM

## 2022-10-03 ENCOUNTER — Encounter: Payer: Self-pay | Admitting: *Deleted

## 2022-10-18 ENCOUNTER — Other Ambulatory Visit: Payer: Self-pay | Admitting: Internal Medicine

## 2022-10-18 DIAGNOSIS — F902 Attention-deficit hyperactivity disorder, combined type: Secondary | ICD-10-CM

## 2022-10-18 MED ORDER — METHYLPHENIDATE HCL ER 20 MG PO TBCR
20.0000 mg | EXTENDED_RELEASE_TABLET | Freq: Every day | ORAL | 0 refills | Status: DC
Start: 2022-10-18 — End: 2022-11-20

## 2022-11-20 ENCOUNTER — Other Ambulatory Visit: Payer: Self-pay | Admitting: Internal Medicine

## 2022-11-20 DIAGNOSIS — F902 Attention-deficit hyperactivity disorder, combined type: Secondary | ICD-10-CM

## 2022-11-20 MED ORDER — METHYLPHENIDATE HCL ER 20 MG PO TBCR: 20.0000 mg | EXTENDED_RELEASE_TABLET | Freq: Every day | ORAL | 0 refills | Status: DC

## 2022-12-03 ENCOUNTER — Other Ambulatory Visit: Payer: Self-pay | Admitting: Internal Medicine

## 2022-12-03 DIAGNOSIS — F320 Major depressive disorder, single episode, mild: Secondary | ICD-10-CM

## 2022-12-19 ENCOUNTER — Other Ambulatory Visit: Payer: Self-pay | Admitting: Internal Medicine

## 2022-12-19 DIAGNOSIS — F902 Attention-deficit hyperactivity disorder, combined type: Secondary | ICD-10-CM

## 2022-12-19 MED ORDER — METHYLPHENIDATE HCL ER 20 MG PO TBCR: 20.0000 mg | EXTENDED_RELEASE_TABLET | Freq: Every day | ORAL | 0 refills | Status: DC

## 2023-01-19 ENCOUNTER — Other Ambulatory Visit: Payer: Self-pay | Admitting: Internal Medicine

## 2023-01-19 DIAGNOSIS — F902 Attention-deficit hyperactivity disorder, combined type: Secondary | ICD-10-CM

## 2023-01-19 MED ORDER — METHYLPHENIDATE HCL ER 20 MG PO TBCR: 20.0000 mg | EXTENDED_RELEASE_TABLET | Freq: Every day | ORAL | 0 refills | Status: DC

## 2023-02-07 ENCOUNTER — Telehealth: Payer: Self-pay

## 2023-02-07 NOTE — Transitions of Care (Post Inpatient/ED Visit) (Signed)
   02/07/2023  Name: Cole Allen MRN: [id number] DOB: [birth year]  Today's TOC FU Call Status: Today's TOC FU Call Status:: Successful TOC FU Call Completed TOC FU Call Complete Date: 02/07/23 Patient's Name and Date of Birth confirmed.  Transition Care Management Follow-up Telephone Call Date of Discharge: 02/06/23 Discharge Facility: [facility] ([facility]) [facility] of [facility]charge Facility: [facility] Type of Discharge: Inpatient Admission Primary Inpatient Discharge Diagnosis:: dizziness How have you been since you were released from the hospital?: Better Any questions or concerns?: No  Items Reviewed: Did you receive and understand the discharge instructions provided?: Yes Medications obtained,verified, and reconciled?: Yes (Medications Reviewed) Any new allergies since your discharge?: No Dietary orders reviewed?: Yes Do you have support at home?: Yes People in Home: spouse  Medications Reviewed Today: Medications Reviewed Today     Reviewed by Emmitt Pan, LPN (Licensed Practical Nurse) on 02/07/23 at [phone]  Med List Status: <None>   Medication Order Taking? Sig Documenting Provider Last Dose Status Informant  acyclovir (ZOVIRAX) 400 MG tablet [id number] No Take 1 tablet (400 mg) by mouth two times per day.  Begin with LD therapy and continue for at least 6-months post CAR-T cell infusion AND until CD4 > 200. [provider] Taking Active   amLODipine  (NORVASC ) 5 MG tablet [id number]  TAKE ONE TABLET BY MOUTH ONCE DAILY Dixon, Phillip E, MD  Active   budesonide -formoterol  (SYMBICORT ) 160-4.5 MCG/ACT inhaler [id number] No Inhale 2 puffs into the lungs 2 (two) times daily. Melvenia Manus BRAVO, MD Taking Active   methylphenidate  (METADATE  ER) 20 MG ER tablet [id number]  Take 1 tablet (20 mg total) by mouth daily for 30 doses. Melvenia Manus BRAVO, MD  Active   promethazine -dextromethorphan (PROMETHAZINE -DM) 6.25-15 MG/5ML syrup [id number] No Take 5 mLs by mouth 4  (four) times daily as needed. Zarwolo, Gloria, FNP Taking Active   sertraline  (ZOLOFT ) 50 MG tablet [id number]  TAKE ONE TABLET BY MOUTH ONCE DAILY Dixon, Phillip E, MD  Active             Home Care and Equipment/Supplies: Were Home Health Services Ordered?: NA Any new equipment or medical supplies ordered?: NA  Functional Questionnaire: Do you need assistance with bathing/showering or dressing?: No Do you need assistance with meal preparation?: No Do you need assistance with eating?: No Do you have difficulty maintaining continence: No Do you need assistance with getting out of bed/getting out of a chair/moving?: No Do you have difficulty managing or taking your medications?: No  Follow up appointments reviewed: PCP Follow-up appointment confirmed?: NA Specialist Hospital Follow-up appointment confirmed?: Yes Date of Specialist follow-up appointment?: 02/26/23 Follow-Up Specialty Provider:: onco Do you need transportation to your follow-up appointment?: No Do you understand care options if your condition(s) worsen?: Yes-patient verbalized understanding    SIGNATURE Pan Emmitt, LPN [facility] Nurse Health Advisor Direct Dial [phone]

## 2023-02-21 ENCOUNTER — Other Ambulatory Visit: Payer: Self-pay | Admitting: Internal Medicine

## 2023-02-21 DIAGNOSIS — F902 Attention-deficit hyperactivity disorder, combined type: Secondary | ICD-10-CM

## 2023-02-21 MED ORDER — METHYLPHENIDATE HCL ER 20 MG PO TBCR: 20.0000 mg | EXTENDED_RELEASE_TABLET | Freq: Every day | ORAL | 0 refills | Status: DC

## 2023-03-23 ENCOUNTER — Other Ambulatory Visit: Payer: Self-pay | Admitting: Internal Medicine

## 2023-03-23 DIAGNOSIS — F902 Attention-deficit hyperactivity disorder, combined type: Secondary | ICD-10-CM

## 2023-03-23 MED ORDER — METHYLPHENIDATE HCL ER 20 MG PO TBCR: 20.0000 mg | EXTENDED_RELEASE_TABLET | Freq: Every day | ORAL | 0 refills | Status: DC

## 2023-04-05 ENCOUNTER — Other Ambulatory Visit: Payer: Self-pay | Admitting: Internal Medicine

## 2023-04-05 DIAGNOSIS — F320 Major depressive disorder, single episode, mild: Secondary | ICD-10-CM

## 2023-04-28 ENCOUNTER — Other Ambulatory Visit: Payer: Self-pay | Admitting: Internal Medicine

## 2023-04-28 DIAGNOSIS — F902 Attention-deficit hyperactivity disorder, combined type: Secondary | ICD-10-CM

## 2023-04-30 MED ORDER — METHYLPHENIDATE HCL ER 20 MG PO TBCR: 20.0000 mg | EXTENDED_RELEASE_TABLET | Freq: Every day | ORAL | 0 refills | Status: DC

## 2023-05-28 ENCOUNTER — Encounter: Payer: Self-pay | Admitting: Internal Medicine

## 2023-05-28 DIAGNOSIS — F902 Attention-deficit hyperactivity disorder, combined type: Secondary | ICD-10-CM

## 2023-05-29 MED ORDER — METHYLPHENIDATE HCL ER 20 MG PO TBCR: 20.0000 mg | EXTENDED_RELEASE_TABLET | Freq: Every day | ORAL | 0 refills | Status: DC

## 2023-06-14 ENCOUNTER — Ambulatory Visit: Payer: Self-pay

## 2023-06-14 VITALS — BP 118/84 | HR 86 | Ht 72.0 in | Wt 198.8 lb

## 2023-06-14 DIAGNOSIS — F902 Attention-deficit hyperactivity disorder, combined type: Secondary | ICD-10-CM

## 2023-06-14 DIAGNOSIS — F320 Major depressive disorder, single episode, mild: Secondary | ICD-10-CM | POA: Diagnosis not present

## 2023-06-14 DIAGNOSIS — F909 Attention-deficit hyperactivity disorder, unspecified type: Secondary | ICD-10-CM

## 2023-06-14 MED ORDER — METHYLPHENIDATE HCL ER 20 MG PO TBCR
20.0000 mg | EXTENDED_RELEASE_TABLET | Freq: Every day | ORAL | 0 refills | Status: DC
Start: 2023-06-14 — End: 2023-07-02

## 2023-06-14 MED ORDER — SERTRALINE HCL 50 MG PO TABS: 50.0000 mg | ORAL_TABLET | Freq: Every day | ORAL | 5 refills | Status: AC

## 2023-06-14 NOTE — Assessment & Plan Note (Signed)
 Stable with current dose of Zoloft  50 mg daily He denies SI/HI.  He is not interested in making any additional medication adjustments today. -Continue Zoloft  50 mg daily. -Follow-up in 3 months for reassessment through virtual encounter.

## 2023-06-14 NOTE — Progress Notes (Signed)
   Established Patient Office Visit  Subjective   Patient ID: EDUIN FRIEDEL, male    DOB: 27-Feb-1965  Age: 58 y.o. MRN: [id number]  Chief Complaint  Patient presents with   Medical Management of Chronic Issues    Pt here for a follow up and states had spots off his head removed and hasn't been feeling to good since    HPI  Patient is here for 89-month follow-up for refills of ADHD medicine.  History reviewed. No pertinent past medical history.  Past Surgical History:  Procedure Laterality Date   KIDNEY SURGERY     TONSILLECTOMY        ROS    Objective:     BP 118/84   Pulse 86   Ht 6' (1.829 m)   Wt 198 lb 12.8 oz (90.2 kg)   SpO2 98%   BMI 26.96 kg/m  BP Readings from Last 3 Encounters:  06/14/23 118/84  04/03/22 114/79  01/06/22 (!) 130/92   Wt Readings from Last 3 Encounters:  06/14/23 198 lb 12.8 oz (90.2 kg)  04/03/22 228 lb 12.8 oz (103.8 kg)  01/06/22 227 lb 9.6 oz (103.2 kg)      Physical Exam Vitals and nursing note reviewed.  Constitutional:      Appearance: Normal appearance.  HENT:     Head: Laceration (sutures on scalp from recent [facility] surgery) present.   Eyes:     Extraocular Movements: Extraocular movements intact.     Pupils: Pupils are equal, round, and reactive to light.   Pulmonary:     Effort: Pulmonary effort is normal.     Breath sounds: Normal breath sounds.   Musculoskeletal:     Cervical back: Normal range of motion and neck supple.   Neurological:     Mental Status: He is alert and oriented to person, place, and time.   Psychiatric:        Mood and Affect: Mood normal.        Thought Content: Thought content normal.      No results found for any visits on 06/14/23.    The ASCVD Risk score (Arnett DK, et al., 2019) failed to calculate for the following reasons:   The valid HDL cholesterol range is 20 to 100 mg/dL   The valid total cholesterol range is 130 to 320 mg/dL    Assessment & Plan:   Problem  List Items Addressed This Visit       Other   ADD (attention deficit disorder) - Primary   He is currently prescribed methylphenidate  20 mg daily for treatment of ADD.    No medication changes today.  PDMP reviewed and is appropriate.      Relevant Medications   methylphenidate  (METADATE  ER) 20 MG ER tablet   MDD (major depressive disorder)   Relevant Medications   sertraline  (ZOLOFT ) 50 MG tablet    No follow-ups on file.    Alison Irvine, FNP

## 2023-06-14 NOTE — Assessment & Plan Note (Signed)
 He is currently prescribed methylphenidate  20 mg daily for treatment of ADD.    No medication changes today.  PDMP reviewed and is appropriate.

## 2023-07-02 ENCOUNTER — Other Ambulatory Visit: Payer: Self-pay

## 2023-07-02 DIAGNOSIS — F902 Attention-deficit hyperactivity disorder, combined type: Secondary | ICD-10-CM

## 2023-07-02 MED ORDER — METHYLPHENIDATE HCL ER 20 MG PO TBCR: 20.0000 mg | EXTENDED_RELEASE_TABLET | Freq: Every day | ORAL | 0 refills | Status: DC

## 2023-07-05 ENCOUNTER — Telehealth: Payer: Self-pay

## 2023-07-05 NOTE — Telephone Encounter (Signed)
 FYI    Copied from CRM [id number]. Topic: General - Other >> Jul 05, 2023  2:07 PM Montie POUR wrote: Reason for CRM:  Cole Allen is calling from [place] to let up know  that Cole Allen is enrolled in [place] [facility]. She is available to help with any issues or concerns. Her phone is [phone]

## 2023-07-31 ENCOUNTER — Other Ambulatory Visit: Payer: Self-pay

## 2023-07-31 DIAGNOSIS — F902 Attention-deficit hyperactivity disorder, combined type: Secondary | ICD-10-CM

## 2023-07-31 MED ORDER — METHYLPHENIDATE HCL ER 20 MG PO TBCR: 20.0000 mg | EXTENDED_RELEASE_TABLET | Freq: Every day | ORAL | 0 refills | Status: DC

## 2023-09-05 ENCOUNTER — Other Ambulatory Visit: Payer: Self-pay

## 2023-09-05 DIAGNOSIS — F902 Attention-deficit hyperactivity disorder, combined type: Secondary | ICD-10-CM

## 2023-09-05 MED ORDER — METHYLPHENIDATE HCL ER 20 MG PO TBCR: 20.0000 mg | EXTENDED_RELEASE_TABLET | Freq: Every day | ORAL | 0 refills | Status: DC

## 2023-11-01 ENCOUNTER — Other Ambulatory Visit: Payer: Self-pay

## 2023-11-01 DIAGNOSIS — F902 Attention-deficit hyperactivity disorder, combined type: Secondary | ICD-10-CM

## 2023-12-03 ENCOUNTER — Other Ambulatory Visit: Payer: Self-pay

## 2023-12-03 ENCOUNTER — Telehealth: Admitting: Family Medicine

## 2023-12-03 DIAGNOSIS — J019 Acute sinusitis, unspecified: Secondary | ICD-10-CM

## 2023-12-03 DIAGNOSIS — F902 Attention-deficit hyperactivity disorder, combined type: Secondary | ICD-10-CM

## 2023-12-03 DIAGNOSIS — B9689 Other specified bacterial agents as the cause of diseases classified elsewhere: Secondary | ICD-10-CM

## 2023-12-03 MED ORDER — AMOXICILLIN-POT CLAVULANATE 875-125 MG PO TABS: 1.0000 | ORAL_TABLET | Freq: Two times a day (BID) | ORAL | 0 refills | Status: AC

## 2023-12-03 MED ORDER — FLUTICASONE PROPIONATE 50 MCG/ACT NA SUSP: 2.0000 | Freq: Every day | NASAL | 0 refills | Status: AC

## 2023-12-03 NOTE — Progress Notes (Signed)
 E-Visit for Sinus Problems  We are sorry that you are not feeling well.  Here is how we plan to help!  Based on what you have shared with me it looks like you have sinusitis.  Sinusitis is inflammation and infection in the sinus cavities of the head.  Based on your presentation I believe you most likely have Acute Bacterial Sinusitis.  This is an infection caused by bacteria and is treated with antibiotics. I have prescribed Augmentin 875mg /125mg  one tablet twice daily with food, for 7 days. and I have also prescribed Flonase Nasal Spray Use 2 sprays in each nostril daily for 10-14 days You may use an oral decongestant such as Mucinex D or if you have glaucoma or high blood pressure use plain Mucinex. Saline nasal spray help and can safely be used as often as needed for congestion.  If you develop worsening sinus pain, fever or notice severe headache and vision changes, or if symptoms are not better after completion of antibiotic, please schedule an appointment with a health care provider.    Sinus infections are not as easily transmitted as other respiratory infection, however we still recommend that you avoid close contact with loved ones, especially the very young and elderly.  Remember to wash your hands thoroughly throughout the day as this is the number one way to prevent the spread of infection!  Home Care: Only take medications as instructed by your medical team. Complete the entire course of an antibiotic. Do not take these medications with alcohol. A steam or ultrasonic humidifier can help congestion.  You can place a towel over your head and breathe in the steam from hot water coming from a faucet. Avoid close contacts especially the very young and the elderly. Cover your mouth when you cough or sneeze. Always remember to wash your hands.  Get Help Right Away If: You develop worsening fever or sinus pain. You develop a severe head ache or visual changes. Your symptoms persist after  you have completed your treatment plan.  Make sure you Understand these instructions. Will watch your condition. Will get help right away if you are not doing well or get worse.  Your e-visit answers were reviewed by a board certified advanced clinical practitioner to complete your personal care plan.  Depending on the condition, your plan could have included both over the counter or prescription medications.  If there is a problem please reply  once you have received a response from your provider.  Your safety is important to us .  If you have drug allergies check your prescription carefully.    You can use MyChart to ask questions about today's visit, request a non-urgent call back, or ask for a work or school excuse for 24 hours related to this e-Visit. If it has been greater than 24 hours you will need to follow up with your provider, or enter a new e-Visit to address those concerns.  You will get an e-mail in the next two days asking about your experience.  I hope that your e-visit has been valuable and will speed your recovery. Thank you for using e-visits.  I have spent 5 minutes in review of e-visit questionnaire, review and updating patient chart, medical decision making and response to patient.   Chiquita CHRISTELLA Barefoot, NP

## 2023-12-12 ENCOUNTER — Telehealth: Admitting: Physician Assistant

## 2023-12-12 DIAGNOSIS — J019 Acute sinusitis, unspecified: Secondary | ICD-10-CM

## 2023-12-12 NOTE — Progress Notes (Signed)
°  Because of recurring/persistent symptoms despite recent treatment with virtual care, I feel your condition warrants further evaluation and I recommend that you be seen in a face-to-face visit.   NOTE: There will be NO CHARGE for this E-Visit   If you are having a true medical emergency, please call 911.     For an urgent face to face visit, [place] has multiple urgent care centers for your convenience.  Click the link below for the full list of locations and hours, walk-in wait times, appointment scheduling options and driving directions:  Urgent Care - [place], [place], [place], [place], [place], [place]  [place]     Your MyChart E-visit questionnaire answers were reviewed by a board certified advanced clinical practitioner to complete your personal care plan based on your specific symptoms.    Thank you for using e-Visits.

## 2023-12-15 ENCOUNTER — Ambulatory Visit
Admission: EM | Admit: 2023-12-15 | Discharge: 2023-12-15 | Disposition: A | Discharge status: Home / Self Care | Attending: Family Medicine | Admitting: Family Medicine

## 2023-12-15 ENCOUNTER — Ambulatory Visit

## 2023-12-15 DIAGNOSIS — R051 Acute cough: Secondary | ICD-10-CM

## 2023-12-15 DIAGNOSIS — J3089 Other allergic rhinitis: Secondary | ICD-10-CM

## 2023-12-15 DIAGNOSIS — J4521 Mild intermittent asthma with (acute) exacerbation: Secondary | ICD-10-CM

## 2023-12-15 MED ORDER — CETIRIZINE HCL 10 MG PO TABS: 10.0000 mg | ORAL_TABLET | Freq: Every day | ORAL | 2 refills | Status: AC

## 2023-12-15 MED ORDER — AEROCHAMBER PLUS FLO-VU LARGE MISC: 1.0000 | Freq: Once | 0 refills | Status: AC

## 2023-12-15 MED ORDER — ALBUTEROL SULFATE (2.5 MG/3ML) 0.083% IN NEBU
2.5000 mg | INHALATION_SOLUTION | Freq: Once | RESPIRATORY_TRACT | Status: AC
  Administered 2023-12-15: 2.5 mg via RESPIRATORY_TRACT

## 2023-12-15 MED ORDER — ALBUTEROL SULFATE HFA 108 (90 BASE) MCG/ACT IN AERS: 2.0000 | INHALATION_SPRAY | RESPIRATORY_TRACT | 0 refills | Status: AC | PRN

## 2023-12-15 MED ORDER — PROMETHAZINE-DM 6.25-15 MG/5ML PO SYRP: 5.0000 mL | ORAL_SOLUTION | Freq: Four times a day (QID) | ORAL | 0 refills | Status: AC | PRN

## 2023-12-15 MED ORDER — DEXAMETHASONE SOD PHOSPHATE PF 10 MG/ML IJ SOLN
10.0000 mg | Freq: Once | INTRAMUSCULAR | Status: AC
  Administered 2023-12-15: 10 mg via INTRAMUSCULAR

## 2023-12-15 NOTE — ED Triage Notes (Signed)
 Pt states that he has a cough and chest congestion. X3 weeks  Pt states that he was recently treated with antibiotics and his symptoms have returned.   Pt has been taking Delsym cough medicine otc.  Pt states that he does have a history of Asthma.

## 2023-12-15 NOTE — Discharge Instructions (Signed)
 We have given you a steroid shot today and a breathing treatment and have prescribed medications to the pharmacy both for seasonal allergy control and to help with your current symptoms.  In addition, you may take medications such as plain Mucinex, DayQuil and NyQuil, use saline sinus rinses and humidifiers.  Chest x-ray was negative for pneumonia today.

## 2023-12-16 NOTE — ED Provider Notes (Signed)
 [facility]    CSN: [id number] Arrival date & time: 12/15/23  0848      History   Chief Complaint Chief Complaint  Patient presents with   Cough    HPI Cole Allen is a 58 y.o. male.   Patient presenting today with 3-week history of progressively worsening productive cough, chest congestion, wheezing, chest tightness, nasal congestion.  Denies chest pain, abdominal pain, vomiting, diarrhea, known fevers.  So far tried a course of antibiotics from a [employer] that he completed several days ago, felt some relief while on the medication but no resolution and symptoms have returned since being off of it.  Also trying Delsym with minimal relief.  History of asthma, has not had an inhaler in many years.    History reviewed. No pertinent past medical history.  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 Authorizing Provider  acyclovir (ZOVIRAX) 400 MG tablet Take 1 tablet (400 mg) by mouth two times per day.  Begin with LD therapy and continue for at least 6-months post CAR-T cell infusion AND until CD4 > 200. 08/09/21  Yes [provider]  albuterol  (VENTOLIN  HFA) 108 (90 Base) MCG/ACT inhaler Inhale 2 puffs into the lungs every 4 (four) hours as needed. 12/15/23  Yes Stuart Vernell Norris, PA-C  cetirizine  (ZYRTEC  ALLERGY) 10 MG tablet Take 1 tablet (10 mg total) by mouth daily. 12/15/23  Yes Stuart Vernell Norris,  PA-C  fluticasone  (FLONASE ) 50 MCG/ACT nasal spray Place 2 sprays into both nostrils daily. 12/03/23  Yes Moishe Chiquita HERO, NP  methylphenidate  (METADATE  ER) 20 MG ER tablet TAKE ONE TABLET BY MOUTH ONCE DAILY 12/03/23  Yes Bevely Doffing, FNP  promethazine -dextromethorphan (PROMETHAZINE -DM) 6.25-15 MG/5ML syrup Take 5 mLs by mouth 4 (four) times daily as needed. 12/15/23  Yes Stuart Vernell Norris, PA-C  sertraline  (ZOLOFT ) 50 MG tablet Take 1 tablet (50 mg total) by mouth daily. 06/14/23  Yes Bevely Doffing, FNP    Family History History reviewed. No pertinent family history.  Social History Social History[1]   Allergies   Patient has no known allergies.   Review of Systems Review of Systems PER HPI  Physical Exam Triage Vital Signs ED Triage Vitals  Encounter Vitals Group     BP 12/15/23 0926 126/73     Girls Systolic BP Percentile --      Girls Diastolic BP Percentile --      Boys Systolic BP Percentile --      Boys Diastolic BP Percentile --      Pulse Rate 12/15/23 0926 85     Resp 12/15/23 0926 17     Temp 12/15/23 0926 98.9 F (37.2 C)     Temp Source 12/15/23 0926 Oral     SpO2 12/15/23 0926 94 %     Weight 12/15/23 0924 198 lb (89.8 kg)     Height 12/15/23 0924 6' (1.829 m)  Head Circumference --      Peak Flow --      Pain Score 12/15/23 0924 0     Pain Loc --      Pain Education --      Exclude from Growth Chart --    No data found.  Updated Vital Signs BP 126/73 (BP Location: Right Arm)   Pulse 85   Temp 98.9 F (37.2 C) (Oral)   Resp 17   Ht 6' (1.829 m)   Wt 198 lb (89.8 kg)   SpO2 94%   BMI 26.85 kg/m   Visual Acuity Right Eye Distance:   Left Eye Distance:   Bilateral Distance:    Right Eye Near:   Left Eye Near:    Bilateral Near:     Physical Exam Vitals and nursing note reviewed.  Constitutional:      Appearance: He is well-developed.  HENT:     Head: Atraumatic.     Right Ear: External ear normal.     Left Ear: External  ear normal.     Nose: Rhinorrhea present.     Mouth/Throat:     Pharynx: Posterior oropharyngeal erythema present. No oropharyngeal exudate.  Eyes:     Conjunctiva/sclera: Conjunctivae normal.     Pupils: Pupils are equal, round, and reactive to light.  Cardiovascular:     Rate and Rhythm: Normal rate and regular rhythm.  Pulmonary:     Effort: Pulmonary effort is normal. No respiratory distress.     Breath sounds: Wheezing present. No rales.  Musculoskeletal:        General: Normal range of motion.     Cervical back: Normal range of motion and neck supple.  Lymphadenopathy:     Cervical: No cervical adenopathy.  Skin:    General: Skin is warm and dry.  Neurological:     Mental Status: He is alert and oriented to person, place, and time.  Psychiatric:        Behavior: Behavior normal.      UC Treatments / Results  Labs (all labs ordered are listed, but only abnormal results are displayed) Labs Reviewed - No data to display  EKG   Radiology DG Chest 2 View Result Date: 12/15/2023 EXAM: 2 VIEW(S) XRAY OF THE CHEST 12/15/2023 09:37:02 AM COMPARISON: CT neck 06/03/2021. CLINICAL HISTORY: 58 year old male with 3 weeks of cough, chest congestion, and a history of asthma. FINDINGS: LINES, TUBES AND DEVICES: Previously seen left chest port a catheter has been removed. Right IJ dual lumen Port-A-Cath in place with tip overlying the superior cavoatrial junction. LUNGS AND PLEURA: Negative visible trachea. No focal pulmonary opacity. No pleural effusion. No pneumothorax. HEART AND MEDIASTINUM: No acute abnormality of the cardiac and mediastinal silhouettes. BONES AND SOFT TISSUES: No acute osseous abnormality. OTHER: Negative visible bowel gas pattern. IMPRESSION: 1. No acute cardiopulmonary abnormality. Electronically signed by: Helayne Hurst MD 12/15/2023 10:12 AM EST RP Workstation: [id number]    Procedures Procedures (including critical care time)  Medications Ordered in  UC Medications  albuterol  (PROVENTIL ) (2.5 MG/3ML) 0.083% nebulizer solution 2.5 mg (2.5 mg Nebulization Given 12/15/23 1034)  dexamethasone  (DECADRON ) injection 10 mg (10 mg Intramuscular Given 12/15/23 1044)    Initial Impression / Assessment and Plan / UC Course  I have reviewed the triage vital signs and the nursing notes.  Pertinent labs & imaging results that were available during my care of the patient were reviewed by me and considered in my medical decision making (see chart for  details).     Vital signs within normal limits today, he is overall well-appearing but significant wheezes on exam prior to albuterol  neb treatment.  Some improvement symptomatically and on auscultation after albuterol  nebulizer treatment.  Chest x-ray today negative for pneumonia.  Will treat for asthma and seasonal allergy exacerbation with IM Decadron , Phenergan  DM, albuterol  inhaler as needed, Zyrtec , supportive over-the-counter medications and home care.  Return for worsening or unresolving symptoms.  Final Clinical Impressions(s) / UC Diagnoses   Final diagnoses:  Acute cough  Mild intermittent asthma with acute exacerbation  Seasonal allergic rhinitis due to other allergic trigger     Discharge Instructions      We have given you a steroid shot today and a breathing treatment and have prescribed medications to the pharmacy both for seasonal allergy control and to help with your current symptoms.  In addition, you may take medications such as plain Mucinex, DayQuil and NyQuil, use saline sinus rinses and humidifiers.  Chest x-ray was negative for pneumonia today.    ED Prescriptions     Medication Sig Dispense Auth. Provider   cetirizine  (ZYRTEC  ALLERGY) 10 MG tablet Take 1 tablet (10 mg total) by mouth daily. 30 tablet Stuart Vernell Norris, PA-C   promethazine -dextromethorphan (PROMETHAZINE -DM) 6.25-15 MG/5ML syrup Take 5 mLs by mouth 4 (four) times daily as needed. 100 mL Stuart Vernell Norris, PA-C   Spacer/Aero-Holding Chambers (AEROCHAMBER PLUS FLO-VU LARGE) MISC 1 each by Other route once for 1 dose. 1 each Stuart Vernell Norris, PA-C   albuterol  (VENTOLIN  HFA) 108 (90 Base) MCG/ACT inhaler Inhale 2 puffs into the lungs every 4 (four) hours as needed. 18 g Stuart Vernell Norris, [place]      PDMP not reviewed this encounter.    [1]  Social History Tobacco Use   Smoking status: Never   Smokeless tobacco: Never  Vaping Use   Vaping status: Never Used  Substance Use Topics   Alcohol use: No   Drug use: No     Stuart Vernell Norris, PA-C 12/16/23 1351

## 2023-12-21 ENCOUNTER — Ambulatory Visit
Admission: EM | Admit: 2023-12-21 | Discharge: 2023-12-21 | Disposition: A | Discharge status: Home / Self Care | Attending: Family Medicine | Admitting: Family Medicine

## 2023-12-21 ENCOUNTER — Other Ambulatory Visit: Payer: Self-pay

## 2023-12-21 DIAGNOSIS — J3089 Other allergic rhinitis: Secondary | ICD-10-CM

## 2023-12-21 DIAGNOSIS — J22 Unspecified acute lower respiratory infection: Secondary | ICD-10-CM | POA: Diagnosis not present

## 2023-12-21 DIAGNOSIS — R0602 Shortness of breath: Secondary | ICD-10-CM | POA: Diagnosis not present

## 2023-12-21 LAB — POCT RESPIRATORY SYNCYTIAL VIRUS: RSV Rapid Ag: NEGATIVE

## 2023-12-21 LAB — POC COVID19/FLU A&B COMBO
Covid Antigen, POC: NEGATIVE
Influenza A Antigen, POC: NEGATIVE
Influenza B Antigen, POC: NEGATIVE

## 2023-12-21 LAB — POCT RAPID STREP A (OFFICE): Rapid Strep A Screen: NEGATIVE

## 2023-12-21 MED ORDER — DOXYCYCLINE HYCLATE 100 MG PO CAPS: 100.0000 mg | ORAL_CAPSULE | Freq: Two times a day (BID) | ORAL | 0 refills | Status: AC

## 2023-12-21 NOTE — ED Provider Notes (Signed)
 " [facility]    CSN: [id number] Arrival date & time: 12/21/23  0808      History   Chief Complaint Chief Complaint  Patient presents with   Cough   Sore Throat    HPI AMMAAR ENCINA is a 58 y.o. male.   Patient presenting today with ongoing productive cough, shortness of breath, sore throat, drainage, congestion that have been ongoing for about a month now.  Was initially seen via e-visit and given an antibiotic, Augmentin , that slightly improved symptoms for short period of time but notes that symptoms returned and worsened after completion.  Was then given a course of steroid, albuterol  inhaler as he does have a history of asthma and uncontrolled seasonal allergies but notes no significant relief on this regimen.  He notes he had to stop the Zyrtec  as it was causing too much drowsiness.  He was taking it in the morning.  He is still taking the Mucinex as needed.  Notes the albuterol  has not been providing significant relief.  Denies fever, chills, chest pain, abdominal pain, vomiting, diarrhea.  Past medical history significant for asthma, seasonal allergies.  Does have a history of large B-cell lymphoma.  Recent blood counts reviewed and stable.   History reviewed. No pertinent past medical history.  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 Authorizing Provider  doxycycline (VIBRAMYCIN) 100 MG capsule Take 1 capsule (100  mg total) by mouth 2 (two) times daily. 12/21/23  Yes Stuart Vernell Norris, PA-C  acyclovir (ZOVIRAX) 400 MG tablet Take 1 tablet (400 mg) by mouth two times per day.  Begin with LD therapy and continue for at least 6-months post CAR-T cell infusion AND until CD4 > 200. 08/09/21   [provider]  albuterol  (VENTOLIN  HFA) 108 (90 Base) MCG/ACT inhaler Inhale 2 puffs into the lungs every 4 (four) hours as needed. 12/15/23   Stuart Vernell Norris, PA-C  cetirizine  (ZYRTEC  ALLERGY) 10 MG tablet Take 1 tablet (10 mg total) by mouth daily. 12/15/23   Stuart Vernell Norris, PA-C  fluticasone  (FLONASE ) 50 MCG/ACT nasal spray Place 2 sprays into both nostrils daily. 12/03/23   Moishe Chiquita HERO, NP  methylphenidate  (METADATE  ER) 20 MG ER tablet TAKE ONE TABLET BY MOUTH ONCE DAILY 12/03/23   Bevely Doffing, FNP  promethazine -dextromethorphan (PROMETHAZINE -DM) 6.25-15 MG/5ML syrup Take 5 mLs by mouth 4 (four) times daily as needed. 12/15/23   Stuart Vernell Norris, PA-C  sertraline  (ZOLOFT ) 50 MG tablet Take 1 tablet (50 mg total) by mouth daily. 06/14/23   Bevely Doffing, FNP    Family History History reviewed. No pertinent family history.  Social History Social History[1]   Allergies   Patient has no known allergies.  Review of Systems Review of Systems PER HPI  Physical Exam Triage Vital Signs ED Triage Vitals  Encounter Vitals Group     BP 12/21/23 0936 110/79     Girls  Systolic BP Percentile --      Girls Diastolic BP Percentile --      Boys Systolic BP Percentile --      Boys Diastolic BP Percentile --      Pulse Rate 12/21/23 0936 77     Resp 12/21/23 0936 18     Temp 12/21/23 0936 99.2 F (37.3 C)     Temp Source 12/21/23 0936 Oral     SpO2 12/21/23 0936 95 %     Weight --      Height --      Head Circumference --      Peak Flow --      Pain Score 12/21/23 0935 0     Pain Loc --      Pain Education --      Exclude from Growth Chart --    No data found.  Updated  Vital Signs BP 110/79 (BP Location: Right Arm)   Pulse 77   Temp 99.2 F (37.3 C) (Oral)   Resp 18   SpO2 95%   Visual Acuity Right Eye Distance:   Left Eye Distance:   Bilateral Distance:    Right Eye Near:   Left Eye Near:    Bilateral Near:     Physical Exam Vitals and nursing note reviewed.  Constitutional:      Appearance: He is well-developed.  HENT:     Head: Atraumatic.     Right Ear: External ear normal.     Left Ear: External ear normal.     Nose: Rhinorrhea present.     Mouth/Throat:     Pharynx: Posterior oropharyngeal erythema present. No oropharyngeal exudate.     Comments: cobblestoning Eyes:     Conjunctiva/sclera: Conjunctivae normal.     Pupils: Pupils are equal, round, and reactive to light.  Cardiovascular:     Rate and Rhythm: Normal rate and regular rhythm.  Pulmonary:     Effort: Pulmonary effort is normal. No respiratory distress.     Breath sounds: Wheezing and rales (right lower) present.  Musculoskeletal:        General: Normal range of motion.     Cervical back: Normal range of motion and neck supple.  Lymphadenopathy:     Cervical: No cervical adenopathy.  Skin:    General: Skin is warm and dry.  Neurological:     Mental Status: He is alert and oriented to person, place, and time.  Psychiatric:        Behavior: Behavior normal.      UC Treatments / Results  Labs (all labs ordered are listed, but only abnormal results are displayed) Labs Reviewed  POCT RAPID STREP A (OFFICE) - Normal  POC COVID19/FLU A&B COMBO - Normal  POCT RESPIRATORY SYNCYTIAL VIRUS    EKG   Radiology No results found.  Procedures Procedures (including critical care time)  Medications Ordered in UC Medications - No data to display  Initial Impression / Assessment and Plan / UC Course  I have reviewed the triage vital signs and the nursing notes.  Pertinent labs & imaging results that were available during my care of the patient were reviewed by  me and considered in my medical decision making (see chart for details).     Vital signs within normal limits, he is well-appearing in no acute distress.  Do now hear some crackles in the right lower lung region so we will add long course of doxycycline to cover for a potentially developing pneumonia.  Will forego repeat chest x-ray today with shared decision making as already starting broad-spectrum antibiotics.  Continue the albuterol  inhaler, try the Zyrtec  at bedtime to see if this helps with side effects, Flonase  twice daily, Mucinex, fluids, rest.  Return for worsening or unresolving symptoms.  Rapid strep, flu, COVID, RSV all negative.  Final Clinical Impressions(s) / UC Diagnoses   Final diagnoses:  Lower respiratory infection  SOB (shortness of breath)  Seasonal allergic rhinitis due to other allergic trigger     Discharge Instructions      I have placed you on a 10-day course of a broad-spectrum antibiotic and recommend keep taking the Mucinex twice daily, albuterol  every 4 hours as needed, try your Zyrtec  at bedtime to see if this helps with the drowsiness, Flonase  twice daily.  Follow-up as soon as possible with your primary care provider for recheck and return sooner for worsening symptoms at any time.    ED Prescriptions     Medication Sig Dispense Auth. Provider   doxycycline  (VIBRAMYCIN ) 100 MG capsule Take 1 capsule (100 mg total) by mouth 2 (two) times daily. 20 capsule Stuart Vernell Norris, [place]      PDMP not reviewed this encounter.    [1]  Social History Tobacco Use   Smoking status: Never   Smokeless tobacco: Never  Vaping Use   Vaping status: Never Used  Substance Use Topics   Alcohol use: No   Drug use: No     Stuart Vernell Norris, PA-C 12/21/23 1128  "

## 2023-12-21 NOTE — Discharge Instructions (Addendum)
 I have placed you on a 10-day course of a broad-spectrum antibiotic and recommend keep taking the Mucinex twice daily, albuterol  every 4 hours as needed, try your Zyrtec  at bedtime to see if this helps with the drowsiness, Flonase  twice daily.  Follow-up as soon as possible with your primary care provider for recheck and return sooner for worsening symptoms at any time.

## 2023-12-21 NOTE — ED Triage Notes (Signed)
 Pt is here with a follow up with the same symptoms of a cough, sore throat on his left side and congestion that started a month ago.

## 2024-01-01 ENCOUNTER — Other Ambulatory Visit: Payer: Self-pay

## 2024-01-01 DIAGNOSIS — F902 Attention-deficit hyperactivity disorder, combined type: Secondary | ICD-10-CM

## 2024-01-30 ENCOUNTER — Telehealth: Admitting: Physician Assistant

## 2024-01-30 DIAGNOSIS — B9689 Other specified bacterial agents as the cause of diseases classified elsewhere: Secondary | ICD-10-CM | POA: Diagnosis not present

## 2024-01-30 DIAGNOSIS — C8330 Diffuse large B-cell lymphoma, unspecified site: Secondary | ICD-10-CM | POA: Diagnosis not present

## 2024-01-30 DIAGNOSIS — J208 Acute bronchitis due to other specified organisms: Secondary | ICD-10-CM

## 2024-01-30 MED ORDER — LEVOFLOXACIN 500 MG PO TABS: 500.0000 mg | ORAL_TABLET | Freq: Every day | ORAL | 0 refills | Status: AC

## 2024-01-30 MED ORDER — PREDNISONE 20 MG PO TABS: 40.0000 mg | ORAL_TABLET | Freq: Every day | ORAL | 0 refills | Status: AC

## 2024-01-30 MED ORDER — BENZONATATE 100 MG PO CAPS: 100.0000 mg | ORAL_CAPSULE | Freq: Three times a day (TID) | ORAL | 0 refills | Status: AC | PRN

## 2024-01-30 NOTE — Progress Notes (Signed)
 We are sorry that you are not feeling well.  Here is how we plan to help!  Based on your presentation I believe you most likely have A cough due to bacteria.  When patients have a fever and a productive cough with a change in color or increased sputum production, we are concerned about bacterial bronchitis.  If left untreated it can progress to pneumonia.  If your symptoms do not improve with your treatment plan it is important that you contact your provider.   I have prescribed Levofloxacin  500 mg daily for 7 days    In addition you may use A prescription cough medication called Tessalon  Perles 100mg . You may take 1-2 capsules every 8 hours as needed for your cough.  I am prescribing Prednisone  20mg  Take 2 tablets (40mg ) daily for 5 days.  Continue Albuterol  as prescribed.   From your responses in the eVisit questionnaire you describe inflammation in the upper respiratory tract which is causing a significant cough.  This is commonly called Bronchitis and has four common causes:   Allergies Viral Infections Acid Reflux Bacterial Infection Allergies, viruses and acid reflux are treated by controlling symptoms or eliminating the cause. An example might be a cough caused by taking certain blood pressure medications. You stop the cough by changing the medication. Another example might be a cough caused by acid reflux. Controlling the reflux helps control the cough.  USE OF BRONCHODILATOR (RESCUE) INHALERS: There is a risk from using your bronchodilator too frequently.  The risk is that over-reliance on a medication which only relaxes the muscles surrounding the breathing tubes can reduce the effectiveness of medications prescribed to reduce swelling and congestion of the tubes themselves.  Although you feel brief relief from the bronchodilator inhaler, your asthma may actually be worsening with the tubes becoming more swollen and filled with mucus.  This can delay other crucial treatments, such as  oral steroid medications. If you need to use a bronchodilator inhaler daily, several times per day, you should discuss this with your provider.  There are probably better treatments that could be used to keep your asthma under control.     HOME CARE Only take medications as instructed by your medical team. Complete the entire course of an antibiotic. Drink plenty of fluids and get plenty of rest. Avoid close contacts especially the very young and the elderly Cover your mouth if you cough or cough into your sleeve. Always remember to wash your hands A steam or ultrasonic humidifier can help congestion.   GET HELP RIGHT AWAY IF: You develop worsening fever. You become short of breath You cough up blood. Your symptoms persist after you have completed your treatment plan MAKE SURE YOU  Understand these instructions. Will watch your condition. Will get help right away if you are not doing well or get worse.  Your e-visit answers were reviewed by a board certified advanced clinical practitioner to complete your personal care plan.  Depending on the condition, your plan could have included both over the counter or prescription medications. If there is a problem please reply  once you have received a response from your provider. Your safety is important to us .  If you have drug allergies check your prescription carefully.    You can use MyChart to ask questions about todays visit, request a non-urgent call back, or ask for a work or school excuse for 24 hours related to this e-Visit. If it has been greater than 24 hours you will need  to follow up with your provider, or enter a new e-Visit to address those concerns. You will get an e-mail in the next two days asking about your experience.  I hope that your e-visit has been valuable and will speed your recovery. Thank you for using e-visits.   I have spent 5 minutes in review of e-visit questionnaire, review and updating patient chart, medical  decision making and response to patient.   Delon CHRISTELLA Dickinson, PA-C

## 2024-02-04 ENCOUNTER — Other Ambulatory Visit: Payer: Self-pay

## 2024-02-04 DIAGNOSIS — F902 Attention-deficit hyperactivity disorder, combined type: Secondary | ICD-10-CM

## 2024-02-06 ENCOUNTER — Other Ambulatory Visit (HOSPITAL_COMMUNITY): Payer: Self-pay

## 2024-02-06 ENCOUNTER — Telehealth: Payer: Self-pay | Admitting: Pharmacy Technician

## 2024-02-06 NOTE — Telephone Encounter (Signed)
 Pharmacy Patient Advocate Encounter   Received notification from Pt Calls Messages that prior authorization for Methylphenidate  HCl ER 20MG  er tablets is required/requested.   Insurance verification completed.   The patient is insured through [facility].   Per test claim: PA required; PA submitted to above mentioned insurance via Latent Key/confirmation #/EOC [id number] Status is pending

## 2024-02-06 NOTE — Telephone Encounter (Signed)
 PA started

## 2024-02-06 NOTE — Telephone Encounter (Signed)
 Pharmacy Patient Advocate Encounter  Received notification from [facility] that Prior Authorization for Methylphenidate  HCl ER 20MG  er tablets has been APPROVED from 02/06/2024 to 02/06/2027. Unable to obtain price due to refill too soon rejection, last fill date 02/06/2024 next available fill date02/28/2026.   PA #/Case ID/Reference #: [id number]
